# Patient Record
Sex: Female | Born: 1980
Health system: Southern US, Community
[De-identification: ages and names within clinical notes are randomized; demographics above are authoritative.]

## PROBLEM LIST (undated history)

## (undated) ENCOUNTER — Inpatient Hospital Stay (HOSPITAL_COMMUNITY): Payer: Self-pay

## (undated) DIAGNOSIS — O2210 Genital varices in pregnancy, unspecified trimester: Secondary | ICD-10-CM

## (undated) DIAGNOSIS — Z8719 Personal history of other diseases of the digestive system: Secondary | ICD-10-CM

## (undated) DIAGNOSIS — Z8619 Personal history of other infectious and parasitic diseases: Secondary | ICD-10-CM

## (undated) DIAGNOSIS — R51 Headache: Secondary | ICD-10-CM

## (undated) DIAGNOSIS — R87619 Unspecified abnormal cytological findings in specimens from cervix uteri: Secondary | ICD-10-CM

## (undated) DIAGNOSIS — I10 Essential (primary) hypertension: Secondary | ICD-10-CM

## (undated) DIAGNOSIS — R7303 Prediabetes: Secondary | ICD-10-CM

## (undated) DIAGNOSIS — IMO0002 Reserved for concepts with insufficient information to code with codable children: Secondary | ICD-10-CM

## (undated) HISTORY — DX: Prediabetes: R73.03

## (undated) HISTORY — DX: Genital varices in pregnancy, unspecified trimester: O22.10

## (undated) HISTORY — DX: Headache: R51

## (undated) HISTORY — DX: Unspecified abnormal cytological findings in specimens from cervix uteri: R87.619

## (undated) HISTORY — DX: Reserved for concepts with insufficient information to code with codable children: IMO0002

## (undated) HISTORY — DX: Personal history of other infectious and parasitic diseases: Z86.19

## (undated) HISTORY — DX: Morbid (severe) obesity due to excess calories: E66.01

## (undated) HISTORY — DX: Personal history of other diseases of the digestive system: Z87.19

---
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
## 2001-07-03 HISTORY — PX: OVARIAN CYST DRAINAGE: SHX325

## 2005-07-03 HISTORY — PX: CHOLECYSTECTOMY: SHX55

## 2010-11-22 LAB — ABO/RH: RH Type: POSITIVE

## 2010-11-22 LAB — GC/CHLAMYDIA PROBE AMP, GENITAL: Chlamydia: NEGATIVE

## 2010-11-22 LAB — HEPATITIS B SURFACE ANTIGEN: Hepatitis B Surface Ag: NEGATIVE

## 2010-11-22 LAB — RUBELLA ANTIBODY, IGM: Rubella: IMMUNE

## 2011-01-11 ENCOUNTER — Other Ambulatory Visit (HOSPITAL_COMMUNITY): Payer: Self-pay | Admitting: Obstetrics and Gynecology

## 2011-01-18 ENCOUNTER — Ambulatory Visit (HOSPITAL_COMMUNITY): Payer: Self-pay

## 2011-01-18 ENCOUNTER — Other Ambulatory Visit (HOSPITAL_COMMUNITY): Payer: Self-pay | Admitting: Obstetrics and Gynecology

## 2011-01-18 ENCOUNTER — Encounter (HOSPITAL_COMMUNITY): Payer: Self-pay

## 2011-01-18 ENCOUNTER — Ambulatory Visit (HOSPITAL_COMMUNITY)
Admission: RE | Admit: 2011-01-18 | Discharge: 2011-01-18 | Disposition: A | Discharge status: Home / Self Care | Payer: Self-pay | Source: Ambulatory Visit | Attending: Obstetrics and Gynecology | Admitting: Obstetrics and Gynecology

## 2011-01-18 DIAGNOSIS — Z3689 Encounter for other specified antenatal screening: Secondary | ICD-10-CM | POA: Insufficient documentation

## 2011-01-18 DIAGNOSIS — E669 Obesity, unspecified: Secondary | ICD-10-CM | POA: Insufficient documentation

## 2011-01-18 DIAGNOSIS — O2690 Pregnancy related conditions, unspecified, unspecified trimester: Secondary | ICD-10-CM

## 2011-03-01 ENCOUNTER — Ambulatory Visit (HOSPITAL_COMMUNITY)
Admission: RE | Admit: 2011-03-01 | Discharge: 2011-03-01 | Disposition: A | Discharge status: Home / Self Care | Payer: Self-pay | Source: Ambulatory Visit | Attending: Obstetrics and Gynecology | Admitting: Obstetrics and Gynecology

## 2011-03-01 VITALS — BP 130/72 | HR 85 | Wt 357.0 lb

## 2011-03-01 DIAGNOSIS — O2690 Pregnancy related conditions, unspecified, unspecified trimester: Secondary | ICD-10-CM

## 2011-03-01 DIAGNOSIS — O99210 Obesity complicating pregnancy, unspecified trimester: Secondary | ICD-10-CM | POA: Insufficient documentation

## 2011-03-01 DIAGNOSIS — E669 Obesity, unspecified: Secondary | ICD-10-CM | POA: Insufficient documentation

## 2011-03-01 DIAGNOSIS — Z3689 Encounter for other specified antenatal screening: Secondary | ICD-10-CM | POA: Insufficient documentation

## 2011-03-01 NOTE — Progress Notes (Signed)
Ultrasound in [facility].  Follow up U/S scheduled 

## 2011-03-02 NOTE — Progress Notes (Signed)
Encounter addended by: Marlana Latus, RN on: 03/02/2011  6:02 PM<BR>     Documentation filed: Episodes

## 2011-03-06 NOTE — Progress Notes (Signed)
See ultrasound report in [facility] 

## 2011-04-12 ENCOUNTER — Ambulatory Visit (HOSPITAL_COMMUNITY)
Admission: RE | Admit: 2011-04-12 | Discharge: 2011-04-12 | Disposition: A | Discharge status: Home / Self Care | Payer: Self-pay | Source: Ambulatory Visit | Attending: Obstetrics and Gynecology | Admitting: Obstetrics and Gynecology

## 2011-04-12 DIAGNOSIS — O358XX0 Maternal care for other (suspected) fetal abnormality and damage, not applicable or unspecified: Secondary | ICD-10-CM | POA: Insufficient documentation

## 2011-04-12 DIAGNOSIS — O99210 Obesity complicating pregnancy, unspecified trimester: Secondary | ICD-10-CM | POA: Insufficient documentation

## 2011-04-12 DIAGNOSIS — O2690 Pregnancy related conditions, unspecified, unspecified trimester: Secondary | ICD-10-CM

## 2011-04-12 DIAGNOSIS — E669 Obesity, unspecified: Secondary | ICD-10-CM | POA: Insufficient documentation

## 2011-05-10 ENCOUNTER — Ambulatory Visit (HOSPITAL_COMMUNITY)
Admission: RE | Admit: 2011-05-10 | Discharge: 2011-05-10 | Disposition: A | Discharge status: Home / Self Care | Payer: Self-pay | Source: Ambulatory Visit | Attending: Obstetrics and Gynecology | Admitting: Obstetrics and Gynecology

## 2011-05-10 DIAGNOSIS — O99210 Obesity complicating pregnancy, unspecified trimester: Secondary | ICD-10-CM | POA: Insufficient documentation

## 2011-05-10 DIAGNOSIS — O2690 Pregnancy related conditions, unspecified, unspecified trimester: Secondary | ICD-10-CM

## 2011-05-10 DIAGNOSIS — E669 Obesity, unspecified: Secondary | ICD-10-CM | POA: Insufficient documentation

## 2011-05-10 DIAGNOSIS — O358XX0 Maternal care for other (suspected) fetal abnormality and damage, not applicable or unspecified: Secondary | ICD-10-CM | POA: Insufficient documentation

## 2011-05-15 ENCOUNTER — Encounter (HOSPITAL_COMMUNITY): Payer: Self-pay | Admitting: *Deleted

## 2011-05-15 ENCOUNTER — Telehealth (HOSPITAL_COMMUNITY): Payer: Self-pay | Admitting: *Deleted

## 2011-05-15 NOTE — Telephone Encounter (Signed)
Preadmission screen  

## 2011-05-28 ENCOUNTER — Inpatient Hospital Stay (HOSPITAL_COMMUNITY): Payer: Medicaid Other | Admitting: Anesthesiology

## 2011-05-28 ENCOUNTER — Encounter (HOSPITAL_COMMUNITY): Payer: Self-pay | Admitting: *Deleted

## 2011-05-28 ENCOUNTER — Encounter (HOSPITAL_COMMUNITY): Payer: Self-pay | Admitting: Obstetrics and Gynecology

## 2011-05-28 ENCOUNTER — Inpatient Hospital Stay (HOSPITAL_COMMUNITY)
Admission: AD | Admit: 2011-05-28 | Discharge: 2011-05-30 | DRG: 775 | Disposition: A | Discharge status: Home / Self Care | Payer: Medicaid Other | Source: Ambulatory Visit | Attending: Obstetrics and Gynecology | Admitting: Obstetrics and Gynecology

## 2011-05-28 ENCOUNTER — Encounter (HOSPITAL_COMMUNITY): Payer: Self-pay | Admitting: Anesthesiology

## 2011-05-28 DIAGNOSIS — Z22330 Carrier of Group B streptococcus: Secondary | ICD-10-CM

## 2011-05-28 DIAGNOSIS — O878 Other venous complications in the puerperium: Secondary | ICD-10-CM | POA: Diagnosis present

## 2011-05-28 DIAGNOSIS — O99214 Obesity complicating childbirth: Secondary | ICD-10-CM | POA: Diagnosis present

## 2011-05-28 DIAGNOSIS — E669 Obesity, unspecified: Secondary | ICD-10-CM | POA: Diagnosis present

## 2011-05-28 DIAGNOSIS — K649 Unspecified hemorrhoids: Secondary | ICD-10-CM | POA: Diagnosis present

## 2011-05-28 DIAGNOSIS — O99892 Other specified diseases and conditions complicating childbirth: Secondary | ICD-10-CM | POA: Diagnosis present

## 2011-05-28 DIAGNOSIS — O2210 Genital varices in pregnancy, unspecified trimester: Secondary | ICD-10-CM | POA: Diagnosis present

## 2011-05-28 LAB — CBC
HCT: 37 % (ref 36.0–46.0)
Hemoglobin: 12.7 g/dL (ref 12.0–15.0)
MCHC: 34.3 g/dL (ref 30.0–36.0)
MCV: 90.2 fL (ref 78.0–100.0)
RDW: 13.9 % (ref 11.5–15.5)

## 2011-05-28 MED ORDER — DIBUCAINE 1 % RE OINT
1.0000 "application " | TOPICAL_OINTMENT | RECTAL | Status: DC | PRN
  Filled 2011-05-28: qty 28

## 2011-05-28 MED ORDER — FAMOTIDINE 20 MG PO TABS
20.0000 mg | ORAL_TABLET | Freq: Two times a day (BID) | ORAL | Status: DC
  Administered 2011-05-29 (×2): 20 mg via ORAL
  Filled 2011-05-28 (×2): qty 1

## 2011-05-28 MED ORDER — DEXTROSE 5 % IV SOLN
2.5000 10*6.[IU] | INTRAVENOUS | Status: DC
  Administered 2011-05-28: 2.5 10*6.[IU] via INTRAVENOUS
  Filled 2011-05-28 (×4): qty 2.5

## 2011-05-28 MED ORDER — LIDOCAINE HCL (PF) 1 % IJ SOLN
30.0000 mL | INTRAMUSCULAR | Status: DC | PRN
  Filled 2011-05-28: qty 30

## 2011-05-28 MED ORDER — LIDOCAINE HCL 1.5 % IJ SOLN
INTRAMUSCULAR | Status: DC | PRN
  Administered 2011-05-28 (×2): 5 mL via EPIDURAL

## 2011-05-28 MED ORDER — LANOLIN HYDROUS EX OINT: TOPICAL_OINTMENT | CUTANEOUS | Status: DC | PRN

## 2011-05-28 MED ORDER — SIMETHICONE 80 MG PO CHEW: 80.0000 mg | CHEWABLE_TABLET | ORAL | Status: DC | PRN

## 2011-05-28 MED ORDER — OXYCODONE-ACETAMINOPHEN 5-325 MG PO TABS
1.0000 | ORAL_TABLET | ORAL | Status: DC | PRN
  Administered 2011-05-29: 1 via ORAL
  Filled 2011-05-28: qty 1

## 2011-05-28 MED ORDER — MEASLES, MUMPS & RUBELLA VAC ~~LOC~~ INJ
0.5000 mL | INJECTION | Freq: Once | SUBCUTANEOUS | Status: DC
  Filled 2011-05-28: qty 0.5

## 2011-05-28 MED ORDER — ONDANSETRON HCL 4 MG/2ML IJ SOLN: 4.0000 mg | Freq: Four times a day (QID) | INTRAMUSCULAR | Status: DC | PRN

## 2011-05-28 MED ORDER — TETANUS-DIPHTH-ACELL PERTUSSIS 5-2.5-18.5 LF-MCG/0.5 IM SUSP
0.5000 mL | Freq: Once | INTRAMUSCULAR | Status: AC
  Administered 2011-05-29: 0.5 mL via INTRAMUSCULAR
  Filled 2011-05-28 (×2): qty 0.5

## 2011-05-28 MED ORDER — FLEET ENEMA 7-19 GM/118ML RE ENEM: 1.0000 | ENEMA | RECTAL | Status: DC | PRN

## 2011-05-28 MED ORDER — PRENATAL PLUS 27-1 MG PO TABS
1.0000 | ORAL_TABLET | Freq: Every day | ORAL | Status: DC
  Administered 2011-05-29 – 2011-05-30 (×2): 1 via ORAL
  Filled 2011-05-28 (×2): qty 1

## 2011-05-28 MED ORDER — ONDANSETRON HCL 4 MG/2ML IJ SOLN: 4.0000 mg | INTRAMUSCULAR | Status: DC | PRN

## 2011-05-28 MED ORDER — OXYTOCIN 20 UNITS IN LACTATED RINGERS INFUSION - SIMPLE: 125.0000 mL/h | INTRAVENOUS | Status: AC

## 2011-05-28 MED ORDER — HYDROCORTISONE 2.5 % RE CREA
1.0000 "application " | TOPICAL_CREAM | Freq: Two times a day (BID) | RECTAL | Status: DC
  Administered 2011-05-29 – 2011-05-30 (×4): 1 via RECTAL
  Filled 2011-05-28: qty 28.35

## 2011-05-28 MED ORDER — OXYTOCIN BOLUS FROM INFUSION
500.0000 mL | Freq: Once | INTRAVENOUS | Status: DC
  Filled 2011-05-28: qty 500

## 2011-05-28 MED ORDER — DIPHENHYDRAMINE HCL 25 MG PO CAPS: 25.0000 mg | ORAL_CAPSULE | Freq: Four times a day (QID) | ORAL | Status: DC | PRN

## 2011-05-28 MED ORDER — PENICILLIN G POTASSIUM 5000000 UNITS IJ SOLR
5.0000 10*6.[IU] | Freq: Once | INTRAVENOUS | Status: AC
  Administered 2011-05-28: 5 10*6.[IU] via INTRAVENOUS
  Filled 2011-05-28: qty 5

## 2011-05-28 MED ORDER — BENZOCAINE-MENTHOL 20-0.5 % EX AERO
INHALATION_SPRAY | CUTANEOUS | Status: AC
  Administered 2011-05-29: 02:00:00
  Filled 2011-05-28: qty 56

## 2011-05-28 MED ORDER — EPHEDRINE 5 MG/ML INJ
10.0000 mg | INTRAVENOUS | Status: DC | PRN
  Filled 2011-05-28: qty 4

## 2011-05-28 MED ORDER — CITRIC ACID-SODIUM CITRATE 334-500 MG/5ML PO SOLN: 30.0000 mL | ORAL | Status: DC | PRN

## 2011-05-28 MED ORDER — ACETAMINOPHEN 325 MG PO TABS: 650.0000 mg | ORAL_TABLET | ORAL | Status: DC | PRN

## 2011-05-28 MED ORDER — LACTATED RINGERS IV SOLN: 500.0000 mL | Freq: Once | INTRAVENOUS | Status: DC

## 2011-05-28 MED ORDER — ONDANSETRON HCL 4 MG PO TABS: 4.0000 mg | ORAL_TABLET | ORAL | Status: DC | PRN

## 2011-05-28 MED ORDER — DIPHENHYDRAMINE HCL 50 MG/ML IJ SOLN: 12.5000 mg | INTRAMUSCULAR | Status: DC | PRN

## 2011-05-28 MED ORDER — ZOLPIDEM TARTRATE 5 MG PO TABS: 5.0000 mg | ORAL_TABLET | Freq: Every evening | ORAL | Status: DC | PRN

## 2011-05-28 MED ORDER — BUTORPHANOL TARTRATE 2 MG/ML IJ SOLN
1.0000 mg | Freq: Once | INTRAMUSCULAR | Status: AC
  Administered 2011-05-28: 1 mg via INTRAVENOUS
  Filled 2011-05-28: qty 1

## 2011-05-28 MED ORDER — FENTANYL 2.5 MCG/ML BUPIVACAINE 1/10 % EPIDURAL INFUSION (WH - ANES)
14.0000 mL/h | INTRAMUSCULAR | Status: DC
  Filled 2011-05-28: qty 60

## 2011-05-28 MED ORDER — SENNOSIDES-DOCUSATE SODIUM 8.6-50 MG PO TABS
2.0000 | ORAL_TABLET | Freq: Every day | ORAL | Status: DC
  Administered 2011-05-29 (×2): 2 via ORAL

## 2011-05-28 MED ORDER — LACTATED RINGERS IV SOLN
INTRAVENOUS | Status: DC
  Administered 2011-05-28: 125 mL/h via INTRAVENOUS

## 2011-05-28 MED ORDER — OXYCODONE-ACETAMINOPHEN 5-325 MG PO TABS: 2.0000 | ORAL_TABLET | ORAL | Status: DC | PRN

## 2011-05-28 MED ORDER — PHENYLEPHRINE 40 MCG/ML (10ML) SYRINGE FOR IV PUSH (FOR BLOOD PRESSURE SUPPORT)
80.0000 ug | PREFILLED_SYRINGE | INTRAVENOUS | Status: DC | PRN
  Filled 2011-05-28: qty 5

## 2011-05-28 MED ORDER — LACTATED RINGERS IV SOLN
500.0000 mL | INTRAVENOUS | Status: DC | PRN
  Administered 2011-05-28 (×2): 1000 mL via INTRAVENOUS

## 2011-05-28 MED ORDER — FENTANYL 2.5 MCG/ML BUPIVACAINE 1/10 % EPIDURAL INFUSION (WH - ANES)
INTRAMUSCULAR | Status: DC | PRN
  Administered 2011-05-28: 14 mL/h via EPIDURAL

## 2011-05-28 MED ORDER — OXYTOCIN 20 UNITS IN LACTATED RINGERS INFUSION - SIMPLE
125.0000 mL/h | Freq: Once | INTRAVENOUS | Status: DC
  Filled 2011-05-28 (×2): qty 1000

## 2011-05-28 MED ORDER — PHENYLEPHRINE 40 MCG/ML (10ML) SYRINGE FOR IV PUSH (FOR BLOOD PRESSURE SUPPORT): 80.0000 ug | PREFILLED_SYRINGE | INTRAVENOUS | Status: DC | PRN

## 2011-05-28 MED ORDER — BENZOCAINE-MENTHOL 20-0.5 % EX AERO
1.0000 "application " | INHALATION_SPRAY | CUTANEOUS | Status: DC | PRN
  Filled 2011-05-28: qty 56

## 2011-05-28 MED ORDER — IBUPROFEN 600 MG PO TABS
600.0000 mg | ORAL_TABLET | Freq: Four times a day (QID) | ORAL | Status: DC
  Administered 2011-05-28 – 2011-05-30 (×7): 600 mg via ORAL
  Filled 2011-05-28 (×8): qty 1

## 2011-05-28 MED ORDER — EPHEDRINE 5 MG/ML INJ: 10.0000 mg | INTRAVENOUS | Status: DC | PRN

## 2011-05-28 MED ORDER — IBUPROFEN 600 MG PO TABS: 600.0000 mg | ORAL_TABLET | Freq: Four times a day (QID) | ORAL | Status: DC | PRN

## 2011-05-28 NOTE — Progress Notes (Signed)
Pt reports having ctx since 0545 this am q 3-5 min. Denies SROM or Bleeding at this time. Reports good fetal movement

## 2011-05-28 NOTE — Anesthesia Procedure Notes (Signed)
Epidural Patient location during procedure: OB Start time: 05/28/2011 2:07 PM End time: 05/28/2011 2:13 PM Reason for block: procedure for pain  Staffing Anesthesiologist: Sandrea Hughs Performed by: anesthesiologist   Preanesthetic Checklist Completed: patient identified, site marked, surgical consent, pre-op evaluation, timeout performed, IV checked, risks and benefits discussed and monitors and equipment checked  Epidural Patient position: sitting Prep: site prepped and draped and DuraPrep Patient monitoring: continuous pulse ox and blood pressure Approach: midline Injection technique: LOR air  Needle:  Needle type: Tuohy  Needle gauge: 17 G Needle length: 9 cm Needle insertion depth: 8 cm Catheter type: closed end flexible Catheter size: 19 Gauge Catheter at skin depth: 14 cm Test dose: negative and 1.5% lidocaine  Assessment Sensory level: T8 Events: blood not aspirated, injection not painful, no injection resistance, negative IV test and no paresthesia

## 2011-05-28 NOTE — Progress Notes (Signed)
Patient ID: Kayla [name], female   DOB: 1981-04-01, 30 y.o.   MRN: [id number] Pt reached full dilatation and with 2 contractions was able to push the baby to crowning. With her third push she delivered the vertex and the vertex did the "turtle sign" I asked the nurses to pull her legs back and to give suprapubic pressure. I reached into the vagina and was able to get the axilla of 1 arm and then easily delivered the 7 pound 6 ounce female infant. Apgars were 8 at 1 minute and 9 at 5 minutes. The placenta delivered intact and the uterus was normal. There were huge hemorrhoids and huge vulvar varicosities necessitating a nurse's services in holding the enormous labia out of the surgical field. The second degree ML laceration was sutured with 3-0 vicryl. The hemorrhoids were at 8 and 12 o'clock. Blood loss was difficult to determine but was almost exclusively from the vaginal and perineal tear.

## 2011-05-28 NOTE — Anesthesia Postprocedure Evaluation (Signed)
Anesthesia Post Note  Patient: Kayla [name]  Procedure(s) Performed: * No procedures listed *  Anesthesia type: Epidural  Patient location: Mother/Baby  Post pain: Pain level controlled  Post assessment: Post-op Vital signs reviewed  Last Vitals:  Filed Vitals:   05/28/11 1815  BP: 100/54  Pulse: 85  Temp: 37.2 C  Resp: 16    Post vital signs: Reviewed  Level of consciousness: awake  Complications: No apparent anesthesia complications

## 2011-05-28 NOTE — Anesthesia Preprocedure Evaluation (Signed)
Anesthesia Evaluation  Patient identified by MRN, date of birth, ID band Patient awake    Reviewed: Allergy & Precautions, H&P , NPO status , Patient's Chart, lab work & pertinent test results  Airway Mallampati: III TM Distance: >3 FB Neck ROM: full    Dental No notable dental hx.    Pulmonary neg pulmonary ROS,  clear to auscultation  Pulmonary exam normal       Cardiovascular neg cardio ROS     Neuro/Psych Negative Psych ROS   GI/Hepatic negative GI ROS, Neg liver ROS,   Endo/Other  Morbid obesity  Renal/GU negative Renal ROS  Genitourinary negative   Musculoskeletal negative musculoskeletal ROS (+)   Abdominal (+) obese,   Peds negative pediatric ROS (+)  Hematology negative hematology ROS (+)   Anesthesia Other Findings   Reproductive/Obstetrics (+) Pregnancy                           Anesthesia Physical Anesthesia Plan  ASA: III  Anesthesia Plan: Epidural   Post-op Pain Management:    Induction:   Airway Management Planned:   Additional Equipment:   Intra-op Plan:   Post-operative Plan:   Informed Consent: I have reviewed the patients History and Physical, chart, labs and discussed the procedure including the risks, benefits and alternatives for the proposed anesthesia with the patient or authorized representative who has indicated his/her understanding and acceptance.     Plan Discussed with:   Anesthesia Plan Comments:         Anesthesia Quick Evaluation

## 2011-05-28 NOTE — Progress Notes (Signed)
Patient ID: Kayla [name], female   DOB: 12/06/1980, 30 y.o.   MRN: [id number] Pt has received an epidural and is comfortable. Per the RN exam at 2:30 PM the cervix is still 8 cm but the vertex has descended to 0 station.

## 2011-05-28 NOTE — H&P (Signed)
NAMEBRESLYN, ABDO NO.:  [id number]  MEDICAL RECORD NO.:  [id number]  LOCATION:  9162                          FACILITY:  [facility]  PHYSICIAN:  Malachi Pro. Ambrose Mantle, M.D. DATE OF BIRTH:  1981/02/27  DATE OF ADMISSION:  05/28/2011 DATE OF DISCHARGE:                             HISTORY & PHYSICAL   HISTORY OF PRESENT ILLNESS:  This is a 30 year old black female, para 0, gravida 1, EDC June 04, 2011, by an ultrasound at 9 weeks and 1/7 days on October 31, 2010.  Blood group and type O positive, negative antibody.  Pap smear normal.  Rubella immune.  RPR nonreactive.  Urine culture negative.  Hepatitis B surface antigen negative, HIV negative, GC and Chlamydia negative, hemoglobin electrophoresis AA, cystic fibrosis screening, first trimester screening, and quad screening all declined.  One hour Glucola 101, group B strep positive.  The patient did have an early ultrasound at 9 weeks and 1 day, gave her a due date of June 04, 2011.  By her dates, her due date was May 29, 2011, and the chosen due date is June 14, 2011.  She had a followup ultrasound in our office, but the anatomy was limited because of her massive obesity.  She did have [facility], and the anatomy seemed to be normal except for an echogenic intracardiac focus.  There was normal fluid volume and growth appeared adequate.  The patient had a relatively benign prenatal course.  She began contracting at 05:54 a.m. on the day of admission, came to the hospital and was felt to be 4 cm dilated, 60%, with a vertex at a -3 and a bulging bag of waters.  She was admitted to the hospital, and on my exam at or 11:05 a.m. or 11:10 a.m., the cervix was 7 cm, 80% to 90%, vertex was at -3.  Artificial rupture of the membranes produced clear fluid.  PAST MEDICAL HISTORY:  She has had an abnormal Pap, but a normal colposcopy.  She had Chlamydia.  She  has had gallstones, headaches, hemorrhoids, an ovarian cyst, and vaginal trichomoniasis.  She has had a cholecystectomy in 2007.  ALLERGIES:  BACTRIM CAUSED BODY ACHES, HEADACHES, NAUSEA, AND VOMITING. SHE HAS NO FOOD ALLERGIES AND NO LATEX ALLERGIES.  FAMILY HISTORY:  Maternal grandmother with breast cancer.  Father with cardiomyopathy and congestive heart failure, died at age 7.  He also had diabetes and hypertension, and had a myocardial infarction. Maternal grandmother also had stomach cancer.  The patient denies alcohol, tobacco, and illicit substance abuse.  PHYSICAL EXAMINATION:  VITAL SIGNS:  On admission, her blood pressure was 95/52 to 117/64, pulse 77-79, temperature 98.4 to 98.9. HEART:  Normal size and sounds.  No murmurs. LUNGS:  Clear to auscultation. ABDOMEN:  The abdomen is markedly obese.  The patient weighs well in excess of 350 pounds.  Fetal heart tones are normal. GU:  Cervix is 7 cm, 80% to 90% vertex at a -3 artificial rupture of the membranes produced clear fluid.  ADMITTING IMPRESSION:  Intrauterine pregnancy at 39 weeks, positive group B strep.  Massive obesity.  The patient is admitted and placed  on penicillin, and her progress in labor will be monitored.     Malachi Pro. Ambrose Mantle, M.D.     TFH/MEDQ  D:  05/28/2011  T:  05/28/2011  Job:  [id number]

## 2011-05-29 LAB — CBC
HCT: 28.3 % — ABNORMAL LOW (ref 36.0–46.0)
Hemoglobin: 9.6 g/dL — ABNORMAL LOW (ref 12.0–15.0)
MCV: 90.1 fL (ref 78.0–100.0)
RBC: 3.14 MIL/uL — ABNORMAL LOW (ref 3.87–5.11)
RDW: 14 % (ref 11.5–15.5)
WBC: 10.9 10*3/uL — ABNORMAL HIGH (ref 4.0–10.5)

## 2011-05-29 NOTE — Progress Notes (Signed)
UR chart review completed.  

## 2011-05-29 NOTE — Progress Notes (Signed)
Patient ID: Kayla [name], female   DOB: 01-25-1981, 30 y.o.   MRN: [id number] #1 afebrile BP normal Did not get SCD"s HGB 12.7 to 9.6.

## 2011-05-30 MED ORDER — OXYCODONE-ACETAMINOPHEN 5-325 MG PO TABS: 1.0000 | ORAL_TABLET | ORAL | Status: AC | PRN

## 2011-05-30 MED ORDER — IBUPROFEN 600 MG PO TABS: 600.0000 mg | ORAL_TABLET | Freq: Four times a day (QID) | ORAL | Status: AC

## 2011-05-30 NOTE — Discharge Summary (Signed)
Kayla [name], BIGFORD NO.:  [id number]  MEDICAL RECORD NO.:  [id number]  LOCATION:  9116                          [facility]ACILITY:  [facility]  PHYSICIAN:  Malachi Pro. Ambrose Mantle, M.D. DATE OF BIRTH:  Sep 19, 1980  DATE OF ADMISSION:  05/28/2011 DATE OF DISCHARGE:                              DISCHARGE SUMMARY   ADDENDUM:  LABORATORY DATA:  Initial hemoglobin was 12.7, hematocrit 37.0, white count 6000, platelet count 218,000.  RPR was nonreactive.  Followup hemoglobin 9.6, hematocrit 28.3, white count 10,900, and platelet count 211,000.     Malachi Pro. Ambrose Mantle, M.D.     TFH/MEDQ  D:  05/30/2011  T:  05/30/2011  Job:  [id number]

## 2011-05-30 NOTE — Progress Notes (Signed)
Patient ID: Kayla [name], female   DOB: May 10, 1981, 30 y.o.   MRN: [id number] #2 afebrile for D/C

## 2011-05-30 NOTE — Discharge Summary (Signed)
Kayla [name], Kayla [name] NO.:  [id number]  MEDICAL RECORD NO.:  [id number]  LOCATION:  9116                          [facility]ACILITY:  [facility]  PHYSICIAN:  Malachi Pro. Ambrose Mantle, M.D. DATE OF BIRTH:  20-May-1981  DATE OF ADMISSION:  05/28/2011 DATE OF DISCHARGE:  05/30/2011                              DISCHARGE SUMMARY   HISTORY AND HOSPITAL COURSE:  This is a 30 year old black female, para 0, gravida 1, EDC June 04, 2011 by an ultrasound at 9 weeks and 1/7th days on March 02, 2011.  Blood group and type O positive, negative antibody.  Pap smear normal.  Rubella immune.  RPR nonreactive.  Urine culture negative.  Hepatitis B surface antigen negative.  HIV negative. GC and Chlamydia negative.  Hemoglobin electrophoresis AA.  Cystic fibrosis screening, first trimester screening, and quad screening all declined.  One-hour Glucola 101.  Group-B strep positive.  The patient's pregnancy was complicated by massive obesity.  She did began contracting on the day of admission at 5:54 a.m., came to the hospital, and was felt to be 4 cm by the admitting nurse in MAU.  She was admitted to the hospital and on my exam at 11:05 a.m., the cervix was 7 cm, 80-90% vertex at a -3 and artificial rupture of the membranes produced clear fluid.  She was begun on penicillin and received an epidural.  She became comfortable.  Per the RN exam at 2:30 a.m., the cervix was still 8 cm, but the vertex had descended to a 0 station.  She reached full dilatation and with 2 contractions was able to push the baby down to crowning with the third push she delivered the vertex and the vertex did the "turtle sign."  I asked the nurses to pull her legs back and give suprapubic pressure.  I reached into the vagina and was able to get the axilla of 1 arm and then easily delivered a 7 pounds 6 ounce female infant.  Apgars were 8 at 1 and 9 at 5 minutes.  The placenta delivered intact.  The uterus was normal.  There were  huge hemorrhoids and huge vulvar varicosities necessitating a nurses services and holding the enormous labia out of the surgical field.  The second-degree midline laceration was sutured with 3-0 Vicryl.  The hemorrhoids were at 8 and 12 o'clock.  Blood loss was difficult to determine but was almost exclusively from the vaginal and perineal tears.  Postpartum, the patient did well and was discharged on the second postpartum day.  DISCHARGE DIAGNOSES:  Intrauterine pregnancy at 39 weeks, delivered vertex, positive group B strep, shoulder dystocia.  OPERATION:  Spontaneous delivery, vertex, repair of second-degree midline laceration.  FINAL CONDITION:  Improved.  DISCHARGE INSTRUCTIONS:  Include our regular discharge instruction booklet.  The patient is to continue her prenatal vitamins.  Her Proctofoam for hemorrhoids, Motrin 600 mg 30 tablets, 1 every 6 hours as needed for pain, and Percocet 5/325 ten tablets, 1 every 6 hours as needed for pain, and to return to the office in 6 weeks for followup examination.     Malachi Pro. Ambrose Mantle, M.D.     TFH/MEDQ  D:  05/30/2011  T:  05/30/2011  Job:  [id number] 

## 2011-11-03 IMAGING — US US OB FOLLOW-UP
1 series · 14 of 28 positions shown · non-contrast
Comparison: none

[Series 1: us ob follow-up · 0.19mm/px · 35 acquisitions, 14 frames shown]
[im 2/35]
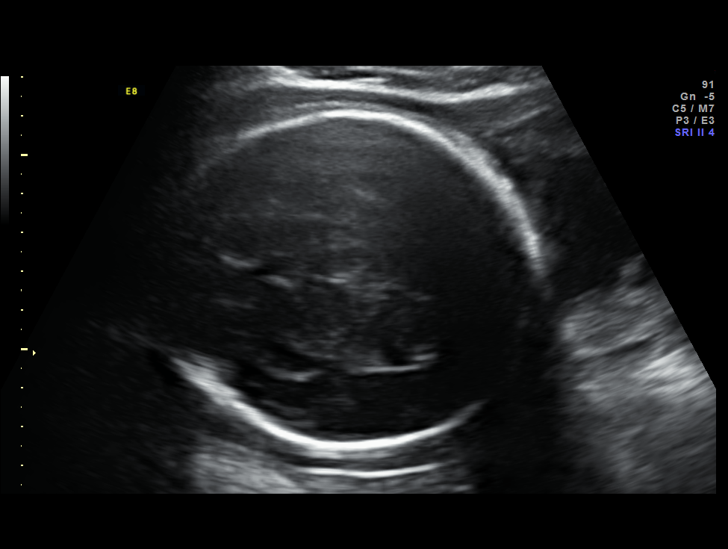
[im 4/35]
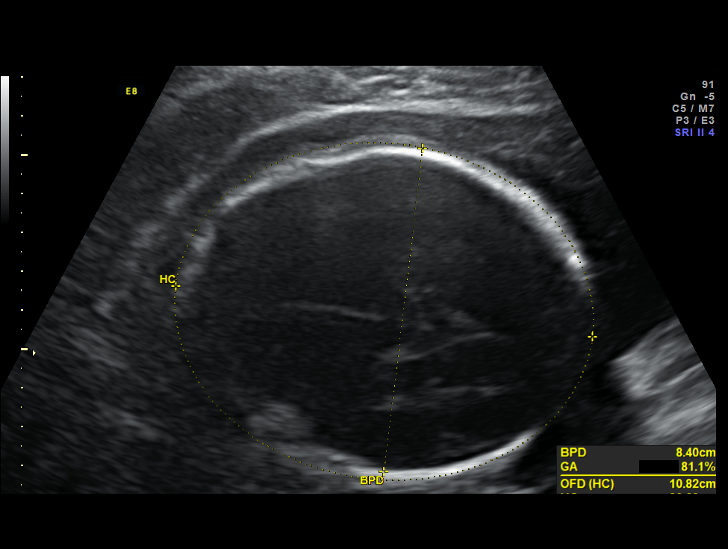
[im 7/35]
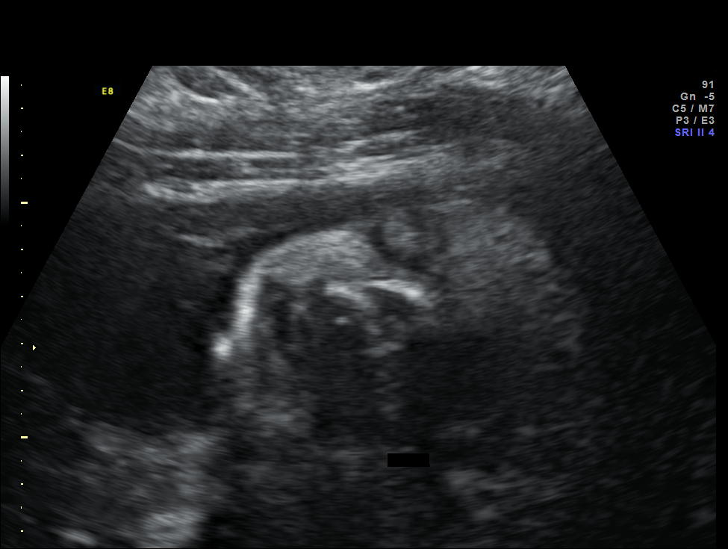
[im 9/35]
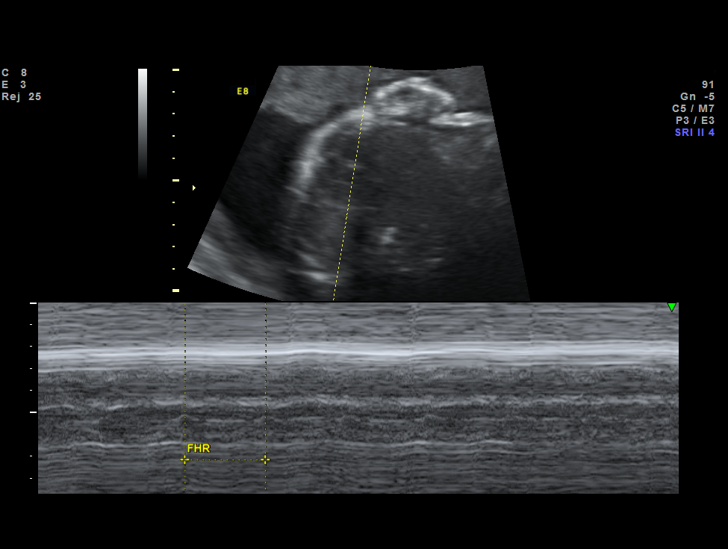
[im 12/35]
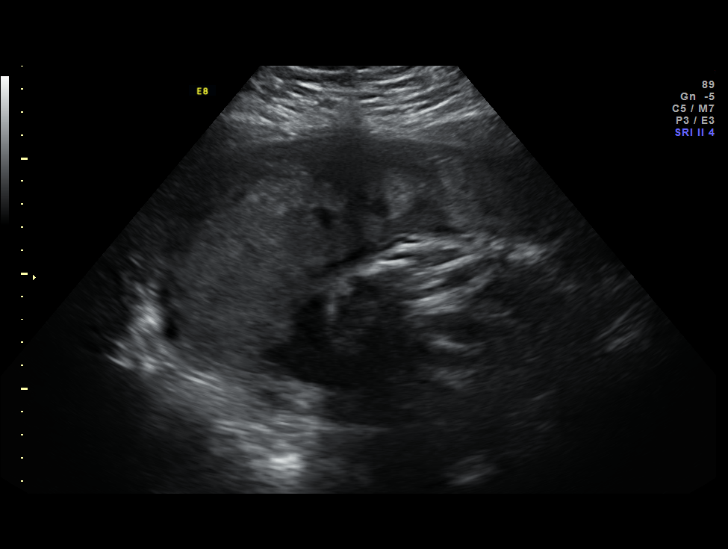
[im 14/35]
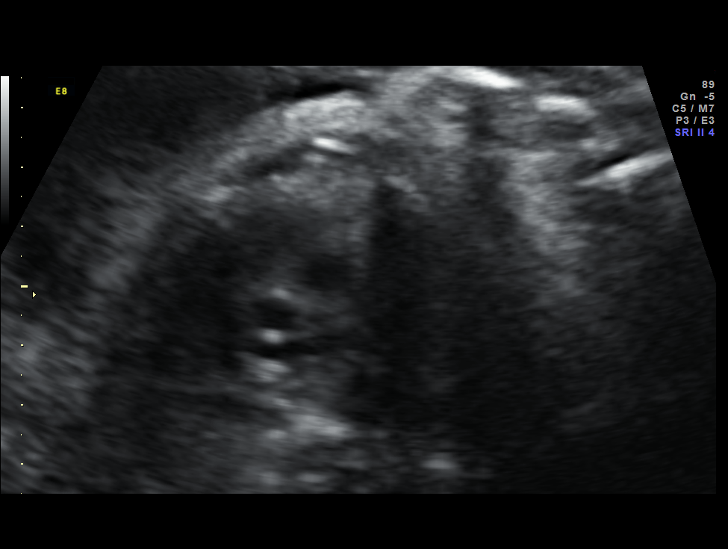
[im 17/35]
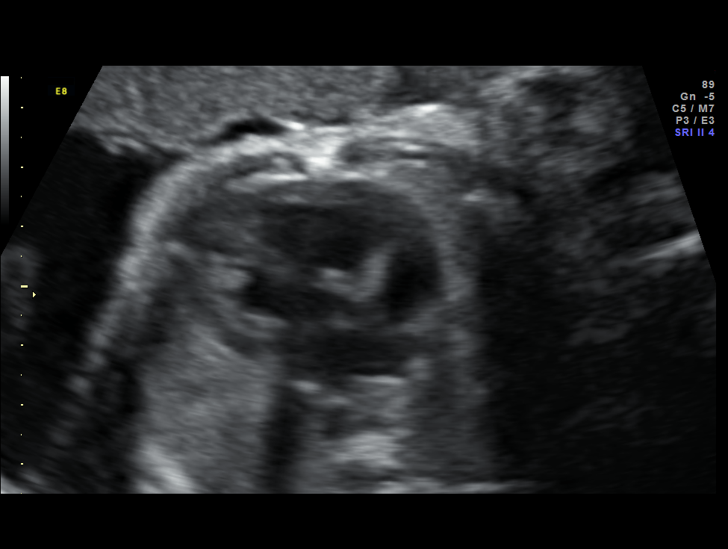
[im 19/35]
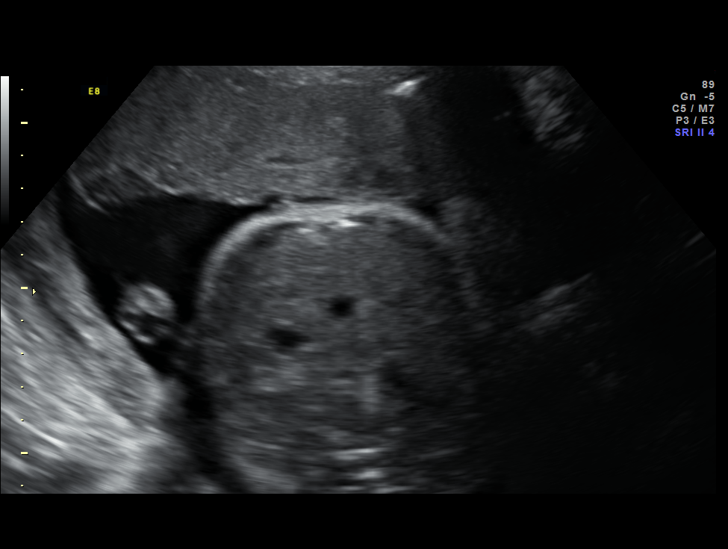
[im 22/35]
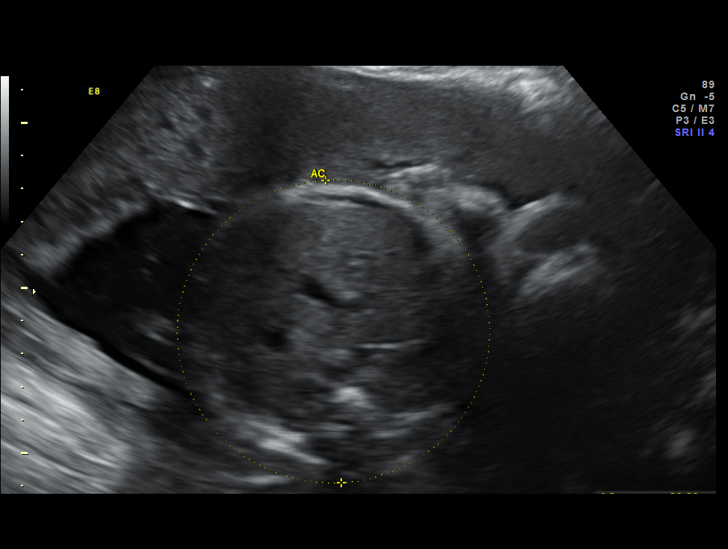
[im 24/35]
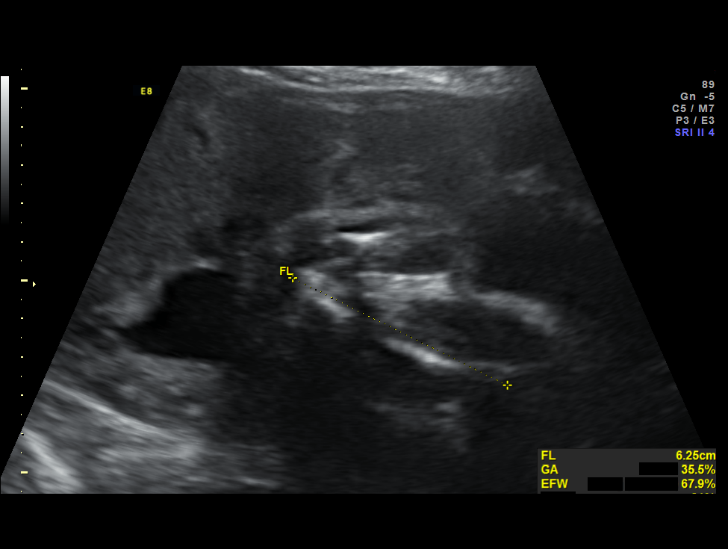
[im 27/35]
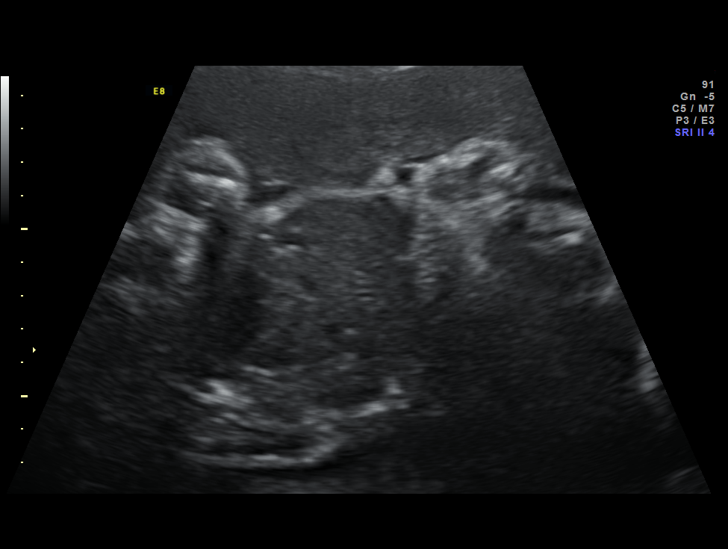
[im 29/35]
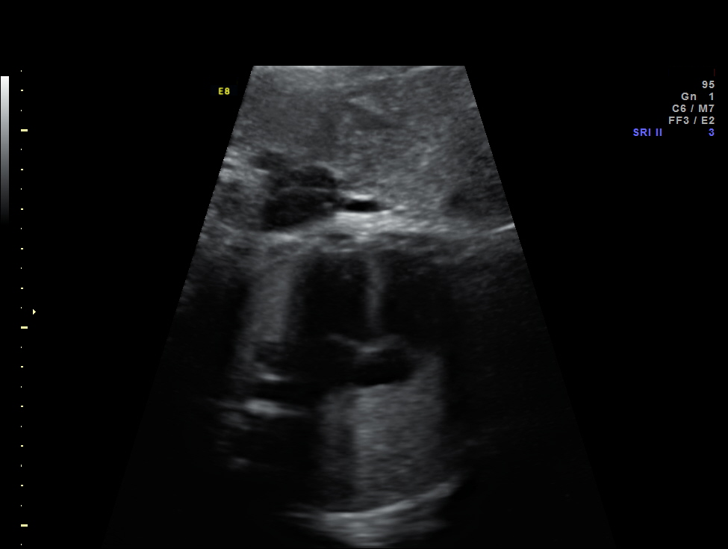
[im 32/35]
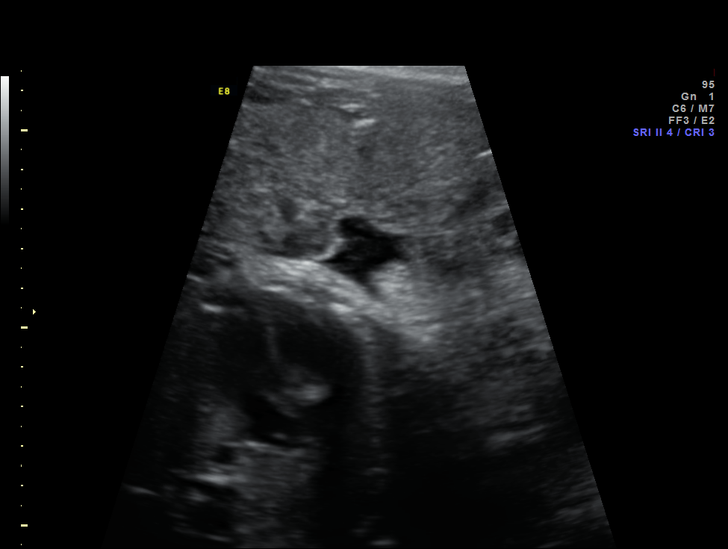
[im 35/35]
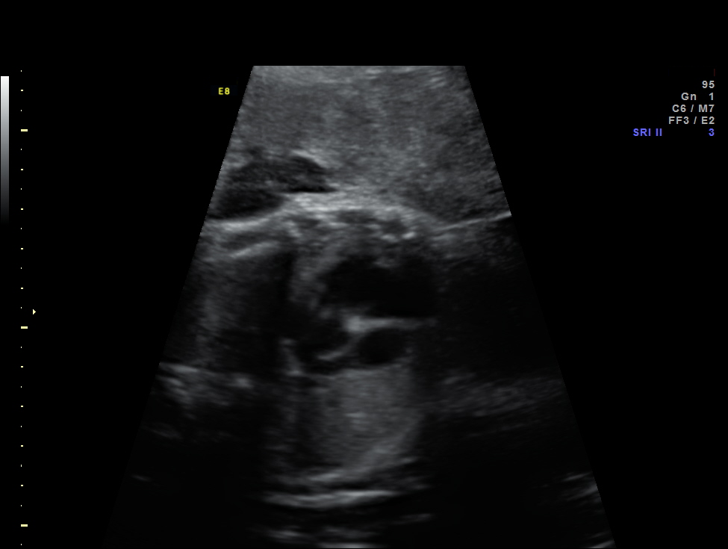

[14 of 28 positions shown; findings below may reference images not displayed]

Canned report from images found in remote index.

Refer to host system for actual result text.

## 2013-03-18 ENCOUNTER — Emergency Department (INDEPENDENT_AMBULATORY_CARE_PROVIDER_SITE_OTHER)
Admission: EM | Admit: 2013-03-18 | Discharge: 2013-03-18 | Disposition: A | Discharge status: Home / Self Care | Payer: Self-pay | Source: Home / Self Care | Attending: Emergency Medicine | Admitting: Emergency Medicine

## 2013-03-18 ENCOUNTER — Encounter (HOSPITAL_COMMUNITY): Payer: Self-pay | Admitting: *Deleted

## 2013-03-18 ENCOUNTER — Other Ambulatory Visit (HOSPITAL_COMMUNITY)
Admission: RE | Admit: 2013-03-18 | Discharge: 2013-03-18 | Disposition: A | Discharge status: Home / Self Care | Payer: Medicaid Other | Source: Ambulatory Visit | Attending: Emergency Medicine | Admitting: Emergency Medicine

## 2013-03-18 DIAGNOSIS — N760 Acute vaginitis: Secondary | ICD-10-CM | POA: Insufficient documentation

## 2013-03-18 DIAGNOSIS — Z113 Encounter for screening for infections with a predominantly sexual mode of transmission: Secondary | ICD-10-CM | POA: Insufficient documentation

## 2013-03-18 DIAGNOSIS — N390 Urinary tract infection, site not specified: Secondary | ICD-10-CM

## 2013-03-18 DIAGNOSIS — R102 Pelvic and perineal pain: Secondary | ICD-10-CM

## 2013-03-18 DIAGNOSIS — N949 Unspecified condition associated with female genital organs and menstrual cycle: Secondary | ICD-10-CM

## 2013-03-18 LAB — POCT URINALYSIS DIP (DEVICE)
Bilirubin Urine: NEGATIVE
Glucose, UA: NEGATIVE mg/dL
Hgb urine dipstick: NEGATIVE
Ketones, ur: NEGATIVE mg/dL
Nitrite: NEGATIVE
pH: 7.5 (ref 5.0–8.0)

## 2013-03-18 MED ORDER — METRONIDAZOLE 500 MG PO TABS: 500.0000 mg | ORAL_TABLET | Freq: Two times a day (BID) | ORAL | Status: DC

## 2013-03-18 MED ORDER — CEPHALEXIN 500 MG PO CAPS: 500.0000 mg | ORAL_CAPSULE | Freq: Three times a day (TID) | ORAL | Status: DC

## 2013-03-18 MED ORDER — TRAMADOL HCL 50 MG PO TABS: 100.0000 mg | ORAL_TABLET | Freq: Three times a day (TID) | ORAL | Status: DC | PRN

## 2013-03-18 NOTE — ED Notes (Signed)
Pt  Reports   Frequent  Scanty   Urination  With  Sensation of  Not  empting  Bladder  Completely          Ambulated  Top room with a  Slow  Steady  Gait     -  Also  Reports  Some low  abd  /  Ovary  Pain  Earlier  At this  Time  She  Is  Sitting  uprigght on  Exam table in no acute  Distress

## 2013-03-18 NOTE — ED Provider Notes (Signed)
Chief Complaint:   Chief Complaint  Patient presents with  . Urinary Frequency    History of Present Illness:    Kayla [name] is a 32 year old female who has had a one-week history of bilateral pelvic pain worse on the right than the left. This radiates to the vagina and she's had some discharge and also radiates through to the back. The pain last for minutes and comes and goes, being rated 5/10 in intensity. She's also had some urinary urgency but denies any dysuria, frequency, or hematuria. She denies fever, chills, nausea, vomiting, anorexia, weight loss. No constipation, diarrhea, blood in the stools. Her menses have been regular. She has a history of ovarian cysts dating back to 2003.  Review of Systems:  Other than noted above, the patient denies any of the following symptoms: Constitutional:  No fever, chills, fatigue, weight loss or anorexia. Lungs:  No cough or shortness of breath. Heart:  No chest pain, palpitations, syncope or edema.  No cardiac history. Abdomen:  No nausea, vomiting, hematememesis, melena, diarrhea, or hematochezia. GU:  No dysuria, frequency, urgency, or hematuria. Gyn:  No vaginal discharge, itching, abnormal bleeding, dyspareunia, or pelvic pain.  PMFSH:  Past medical history, family history, social history, meds, and allergies were reviewed along with nurse's notes.  No prior abdominal surgeries, past history of GI problems, STDs or GYN problems.  No history of aspirin or NSAID use.  No excessive alcohol intake. She is allergic to Bactrim.  Physical Exam:   Vital signs:  BP 149/89  Pulse 74  Temp(Src) 98.2 F (36.8 C) (Oral)  Resp 18  SpO2 98% Gen:  Alert, oriented, in no distress. Lungs:  Breath sounds clear and equal bilaterally.  No wheezes, rales or rhonchi. Heart:  Regular rhythm.  No gallops or murmers.   Abdomen:  Abdomen is soft, flat, nondistended, but she is morbidly obese and examination is difficult. There is no bowel tenderness to  palpation, guarding, rebound. No organomegaly or mass. Bowel sounds were normally active. Pelvic:  Normal external genitalia, vaginal and cervical mucosa were normal. There was a small amount of discharge. No pain on cervical motion. Uterus was midposition, normal in size and shape, no adnexal masses or tenderness. Exam was limited by patient's bodily habitus. Skin:  Clear, warm and dry.  No rash.  Labs:   Results for orders placed during the hospital encounter of 03/18/13  POCT URINALYSIS DIP (DEVICE)      Result Value Range   Glucose, UA NEGATIVE  NEGATIVE mg/dL   Bilirubin Urine NEGATIVE  NEGATIVE   Ketones, ur NEGATIVE  NEGATIVE mg/dL   Specific Gravity, Urine 1.020  1.005 - 1.030   Hgb urine dipstick NEGATIVE  NEGATIVE   pH 7.5  5.0 - 8.0   Protein, ur NEGATIVE  NEGATIVE mg/dL   Urobilinogen, UA 0.2  0.0 - 1.0 mg/dL   Nitrite NEGATIVE  NEGATIVE   Leukocytes, UA SMALL (*) NEGATIVE  POCT PREGNANCY, URINE      Result Value Range   Preg Test, Ur NEGATIVE  NEGATIVE    Assessment:  The primary encounter diagnosis was Pelvic pain. Diagnoses of Vaginitis and UTI (lower urinary tract infection) were also pertinent to this visit.  Differential diagnosis includes PID, ovarian cyst, fibroid tumors, endometriosis, or urinary tract infection. We'll treat for right now for UTI since she did have a small amount of leukocytes present, and BV. Results of DNA probes are pending. Suggested she followup with her gynecologist, Dr. Hilbert Bible,  for pelvic ultrasound to rule out ovarian cyst or fibroid tumors. I cannot definitely rule this out in the base of her exam today because of her bodily habitus.  Plan:   1.  Meds:  The following meds were prescribed:   Discharge Medication List as of 03/18/2013 12:13 PM    START taking these medications   Details  cephALEXin (KEFLEX) 500 MG capsule Take 1 capsule (500 mg total) by mouth 3 (three) times daily., Starting 03/18/2013, Until Discontinued, Normal     metroNIDAZOLE (FLAGYL) 500 MG tablet Take 1 tablet (500 mg total) by mouth 2 (two) times daily., Starting 03/18/2013, Until Discontinued, Normal    traMADol (ULTRAM) 50 MG tablet Take 2 tablets (100 mg total) by mouth every 8 (eight) hours as needed for pain., Starting 03/18/2013, Until Discontinued, Normal        2.  Patient Education/Counseling:  The patient was given appropriate handouts, self care instructions, and instructed in symptomatic relief.   3.  Follow up:  The patient was told to follow up if no better in 3 to 4 days, if becoming worse in any way, and given some red flag symptoms such as worsening pain which would prompt immediate return.  Follow up with Dr. Hilbert Bible within the next 2 weeks.    Reuben Likes, MD 03/18/13 [phone]

## 2013-03-19 LAB — URINE CULTURE
Colony Count: NO GROWTH
Culture: NO GROWTH
Special Requests: NORMAL

## 2013-03-19 NOTE — ED Notes (Signed)
GC/Chlamydia neg., Affirm: Candida and Trich neg., Gardnerella pos., Urine culture: No growth.  Pt. Adequately treated with Flagyl. Vassie Moselle 03/19/2013

## 2013-04-06 ENCOUNTER — Encounter (HOSPITAL_COMMUNITY): Payer: Self-pay | Admitting: Emergency Medicine

## 2013-04-06 ENCOUNTER — Emergency Department (HOSPITAL_COMMUNITY)
Admission: EM | Admit: 2013-04-06 | Discharge: 2013-04-06 | Disposition: A | Discharge status: Home / Self Care | Payer: Medicaid Other | Attending: Emergency Medicine | Admitting: Emergency Medicine

## 2013-04-06 DIAGNOSIS — N390 Urinary tract infection, site not specified: Secondary | ICD-10-CM | POA: Insufficient documentation

## 2013-04-06 DIAGNOSIS — Z87898 Personal history of other specified conditions: Secondary | ICD-10-CM | POA: Insufficient documentation

## 2013-04-06 DIAGNOSIS — Z8619 Personal history of other infectious and parasitic diseases: Secondary | ICD-10-CM | POA: Insufficient documentation

## 2013-04-06 DIAGNOSIS — Z8679 Personal history of other diseases of the circulatory system: Secondary | ICD-10-CM | POA: Insufficient documentation

## 2013-04-06 DIAGNOSIS — Z8742 Personal history of other diseases of the female genital tract: Secondary | ICD-10-CM | POA: Insufficient documentation

## 2013-04-06 DIAGNOSIS — Z79899 Other long term (current) drug therapy: Secondary | ICD-10-CM | POA: Insufficient documentation

## 2013-04-06 DIAGNOSIS — Z3202 Encounter for pregnancy test, result negative: Secondary | ICD-10-CM | POA: Insufficient documentation

## 2013-04-06 LAB — URINALYSIS W MICROSCOPIC + REFLEX CULTURE
Ketones, ur: NEGATIVE mg/dL
Nitrite: POSITIVE — AB
Protein, ur: 100 mg/dL — AB
Urobilinogen, UA: 1 mg/dL (ref 0.0–1.0)

## 2013-04-06 LAB — WET PREP, GENITAL
Clue Cells Wet Prep HPF POC: NONE SEEN
Trich, Wet Prep: NONE SEEN

## 2013-04-06 MED ORDER — PHENAZOPYRIDINE HCL 100 MG PO TABS: 100.0000 mg | ORAL_TABLET | Freq: Three times a day (TID) | ORAL | Status: DC

## 2013-04-06 MED ORDER — NITROFURANTOIN MONOHYD MACRO 100 MG PO CAPS
100.0000 mg | ORAL_CAPSULE | ORAL | Status: AC
  Administered 2013-04-06: 100 mg via ORAL
  Filled 2013-04-06: qty 1

## 2013-04-06 MED ORDER — NITROFURANTOIN MONOHYD MACRO 100 MG PO CAPS: 100.0000 mg | ORAL_CAPSULE | Freq: Two times a day (BID) | ORAL | Status: DC

## 2013-04-06 NOTE — ED Provider Notes (Signed)
CSN: [id number]     Arrival date & time 04/06/13  2115 History   First MD Initiated Contact with Patient 04/06/13 2148     Chief Complaint  Patient presents with  . Hematuria  . Urinary Frequency    HPI Pt was seen at 2210. Per pt, c/o gradual onset and persistence of constant dysuria for the past 2 to 3 days. Has been associated with urinary frequency, urgency and vaginal discharge. Pt states she had similar symptoms 2 weeks ago, was evaluated at [facility], dx UTI and vaginitis, rx keflex and flagyl. States she took these meds with improvement in her symptoms. Pt states she took an OTC "yeast infection medicine" 3 days ago when her new symptoms began, without improvement. Denies hematuria, no vaginal bleeding, no back/flank pain, no abd pain, no N/V/D, no fevers, no rash.     Past Medical History  Diagnosis Date  . Morbidly obese   . History of hemorrhoids   . Varicose vulva in pregnancy   . Abnormal Pap smear   . History of chlamydia   . Headache(784.0)   . History of hemorrhoids    Past Surgical History  Procedure Laterality Date  . Cholecystectomy  2007  . Ovarian cyst drainage  2003   Family History  Problem Relation Age of Onset  . Cardiomyopathy Father   . Heart failure Father   . Diabetes Father   . Hypertension Father   . Heart attack Father   . Hypertension Paternal Uncle   . Cancer Maternal Grandmother     breast and stomach  . Anesthesia problems Neg Hx   . Hypotension Neg Hx   . Malignant hyperthermia Neg Hx   . Pseudochol deficiency Neg Hx    History  Substance Use Topics  . Smoking status: Never Smoker   . Smokeless tobacco: Never Used  . Alcohol Use: No   OB History   Grav Para Term Preterm Abortions TAB SAB Ect Mult Living   1 1 1  0 0 0 0 0 0 1     Review of Systems ROS: Statement: All systems negative except as marked or noted in the HPI; Constitutional: Negative for fever and chills. ; ; Eyes: Negative for eye pain, redness and discharge. ; ; ENMT:  Negative for ear pain, hoarseness, nasal congestion, sinus pressure and sore throat. ; ; Cardiovascular: Negative for chest pain, palpitations, diaphoresis, dyspnea and peripheral edema. ; ; Respiratory: Negative for cough, wheezing and stridor. ; ; Gastrointestinal: Negative for nausea, vomiting, diarrhea, abdominal pain, blood in stool, hematemesis, jaundice and rectal bleeding. . ; ; Genitourinary: +dysuria, urinary frequency. Negative for flank pain and hematuria.;; GYN:  No vaginal bleeding, +vaginal discharge, no vulvar pain.;; Musculoskeletal: Negative for back pain and neck pain. Negative for swelling and trauma.; ; Skin: Negative for pruritus, rash, abrasions, blisters, bruising and skin lesion.; ; Neuro: Negative for headache, lightheadedness and neck stiffness. Negative for weakness, altered level of consciousness , altered mental status, extremity weakness, paresthesias, involuntary movement, seizure and syncope.     Allergies  Bactrim  Home Medications   Current Outpatient Rx  Name  Route  Sig  Dispense  Refill  . acetaminophen (TYLENOL) 500 MG tablet   Oral   Take 500 mg by mouth every 6 (six) hours as needed for pain.         . miconazole (MICOTIN) 2 % cream   Topical   Apply 1 application topically 2 (two) times daily as needed (itch).         Marland Kitchen  traMADol (ULTRAM) 50 MG tablet   Oral   Take 2 tablets (100 mg total) by mouth every 8 (eight) hours as needed for pain.   30 tablet   0   . vitamin E 400 UNIT capsule   Oral   Take 400 Units by mouth every morning.         . cephALEXin (KEFLEX) 500 MG capsule   Oral   Take 1 capsule (500 mg total) by mouth 3 (three) times daily.   30 capsule   0   . metroNIDAZOLE (FLAGYL) 500 MG tablet   Oral   Take 1 tablet (500 mg total) by mouth 2 (two) times daily.   14 tablet   0    BP 152/95  Pulse 93  Temp(Src) 99.2 F (37.3 C) (Oral)  Resp 18  SpO2 98%  LMP 02/26/2013 Physical Exam 2215: Physical examination:   Nursing notes reviewed; Vital signs and O2 SAT reviewed;  Constitutional: Well developed, Well nourished, Well hydrated, In no acute distress; Head:  Normocephalic, atraumatic; Eyes: EOMI, PERRL, No scleral icterus; ENMT: Mouth and pharynx normal, Mucous membranes moist; Neck: Supple, Full range of motion, No lymphadenopathy; Cardiovascular: Regular rate and rhythm, No murmur, rub, or gallop; Respiratory: Breath sounds clear & equal bilaterally, No rales, rhonchi, wheezes.  Speaking full sentences with ease, Normal respiratory effort/excursion; Chest: Nontender, Movement normal; Abdomen: Soft, Nontender, Nondistended, Normal bowel sounds; Genitourinary: No CVA tenderness. Pelvic exam performed with permission of pt and female ED tech assist during exam.  External genitalia w/o lesions. Vaginal vault with thick white discharge.  Cervix w/o lesions, not friable, GC/chlam and wet prep obtained and sent to lab.  Bimanual exam w/o CMT, uterine or adnexal tenderness.;; Extremities: Pulses normal, No tenderness, No edema, No calf edema or asymmetry.; Neuro: AA&Ox3, Major CN grossly intact.  Speech clear. Climbs on and off stretcher easily by herself. Gait steady. No gross focal motor or sensory deficits in extremities.; Skin: Color normal, Warm, Dry.   ED Course  Procedures     MDM  MDM Reviewed: previous chart, nursing note and vitals Reviewed previous: labs Interpretation: labs     Results for orders placed during the hospital encounter of 04/06/13  WET PREP, GENITAL      Result Value Range   Yeast Wet Prep HPF POC NONE SEEN  NONE SEEN   Trich, Wet Prep NONE SEEN  NONE SEEN   Clue Cells Wet Prep HPF POC NONE SEEN  NONE SEEN   WBC, Wet Prep HPF POC TOO NUMEROUS TO COUNT (*) NONE SEEN  URINALYSIS W MICROSCOPIC + REFLEX CULTURE      Result Value Range   Color, Urine AMBER (*) YELLOW   APPearance CLOUDY (*) CLEAR   Specific Gravity, Urine 1.022  1.005 - 1.030   pH 6.5  5.0 - 8.0   Glucose, UA  NEGATIVE  NEGATIVE mg/dL   Hgb urine dipstick LARGE (*) NEGATIVE   Bilirubin Urine NEGATIVE  NEGATIVE   Ketones, ur NEGATIVE  NEGATIVE mg/dL   Protein, ur 161 (*) NEGATIVE mg/dL   Urobilinogen, UA 1.0  0.0 - 1.0 mg/dL   Nitrite POSITIVE (*) NEGATIVE   Leukocytes, UA MODERATE (*) NEGATIVE   WBC, UA TOO NUMEROUS TO COUNT  <3 WBC/hpf   RBC / HPF TOO NUMEROUS TO COUNT  <3 RBC/hpf   Bacteria, UA MANY (*) RARE   Squamous Epithelial / LPF FEW (*) RARE  PREGNANCY, URINE      Result Value Range  Preg Test, Ur NEGATIVE  NEGATIVE    2340:  Will tx for UTI. UC is pending. Dx and testing d/w pt and family.  Questions answered.  Verb understanding, agreeable to d/c home with outpt f/u.      Laray Anger, DO 04/07/13 1318

## 2013-04-06 NOTE — ED Notes (Signed)
Pt reports being seen at Urgent Care x2 weeks ago for a UTI, placed on an ABX, began to have vaginal itching x3 days ago and took OTC medications for a yeast infection. Pt reports noticing urgency and frequency of urination again, yesterday, increasing today with blood noted to urine.

## 2013-04-07 LAB — GC/CHLAMYDIA PROBE AMP: GC Probe RNA: NEGATIVE

## 2013-04-08 LAB — URINE CULTURE

## 2013-04-09 ENCOUNTER — Telehealth (HOSPITAL_COMMUNITY): Payer: Self-pay | Admitting: Emergency Medicine

## 2013-04-09 NOTE — ED Notes (Signed)
Post ED Visit - Positive Culture Follow-up  Culture report reviewed by antimicrobial stewardship pharmacist: []  Wes Dulaney, Pharm.D., BCPS [x]  Celedonio Miyamoto, [address].D., BCPS []  Georgina Pillion, Pharm.D., BCPS []  [place], [address].D., BCPS, AAHIVP []  Estella Husk, Pharm.D., BCPS, AAHIVP  Positive urine culture Treated with Macrobid, organism sensitive to the same and no further patient follow-up is required at this time.  Kayla [name] 04/09/2013, 11:46 AM

## 2013-05-08 ENCOUNTER — Other Ambulatory Visit: Payer: Self-pay

## 2013-09-03 ENCOUNTER — Emergency Department (HOSPITAL_COMMUNITY)
Admission: EM | Admit: 2013-09-03 | Discharge: 2013-09-03 | Disposition: A | Discharge status: Home / Self Care | Payer: 59 | Attending: Emergency Medicine | Admitting: Emergency Medicine

## 2013-09-03 ENCOUNTER — Encounter (HOSPITAL_COMMUNITY): Payer: Self-pay | Admitting: Emergency Medicine

## 2013-09-03 DIAGNOSIS — R358 Other polyuria: Secondary | ICD-10-CM | POA: Insufficient documentation

## 2013-09-03 DIAGNOSIS — R3589 Other polyuria: Secondary | ICD-10-CM | POA: Insufficient documentation

## 2013-09-03 DIAGNOSIS — R350 Frequency of micturition: Secondary | ICD-10-CM | POA: Insufficient documentation

## 2013-09-03 DIAGNOSIS — R42 Dizziness and giddiness: Secondary | ICD-10-CM | POA: Insufficient documentation

## 2013-09-03 DIAGNOSIS — Z8679 Personal history of other diseases of the circulatory system: Secondary | ICD-10-CM | POA: Insufficient documentation

## 2013-09-03 DIAGNOSIS — Z8619 Personal history of other infectious and parasitic diseases: Secondary | ICD-10-CM | POA: Insufficient documentation

## 2013-09-03 DIAGNOSIS — O99210 Obesity complicating pregnancy, unspecified trimester: Secondary | ICD-10-CM

## 2013-09-03 DIAGNOSIS — Z3490 Encounter for supervision of normal pregnancy, unspecified, unspecified trimester: Secondary | ICD-10-CM

## 2013-09-03 DIAGNOSIS — E669 Obesity, unspecified: Secondary | ICD-10-CM | POA: Insufficient documentation

## 2013-09-03 DIAGNOSIS — O9989 Other specified diseases and conditions complicating pregnancy, childbirth and the puerperium: Secondary | ICD-10-CM | POA: Insufficient documentation

## 2013-09-03 DIAGNOSIS — R3915 Urgency of urination: Secondary | ICD-10-CM | POA: Insufficient documentation

## 2013-09-03 LAB — URINALYSIS, ROUTINE W REFLEX MICROSCOPIC
BILIRUBIN URINE: NEGATIVE
Glucose, UA: NEGATIVE mg/dL
HGB URINE DIPSTICK: NEGATIVE
Ketones, ur: NEGATIVE mg/dL
Leukocytes, UA: NEGATIVE
Nitrite: NEGATIVE
Protein, ur: NEGATIVE mg/dL
SPECIFIC GRAVITY, URINE: 1.016 (ref 1.005–1.030)
UROBILINOGEN UA: 0.2 mg/dL (ref 0.0–1.0)
pH: 5 (ref 5.0–8.0)

## 2013-09-03 LAB — POC URINE PREG, ED: Preg Test, Ur: POSITIVE — AB

## 2013-09-03 NOTE — ED Notes (Signed)
Pt states frequent urination for 3 weeks.  Pt also describes dizziness with feeling like she is going to pass out.  Pt states dark loose stools last week.  No fever, diarrhea, abdominal pain at this time.  Pt also states she has missed a period but this is normal for her.  Pt is having unprotected intercourse and tested 3 weeks ago which was negative.

## 2013-09-03 NOTE — ED Provider Notes (Signed)
CSN: [id number]     Arrival date & time 09/03/13  1221 History   This chart was scribed for No att. providers found by Ladona Ridgelaylor Day, ED scribe. This patient was seen in room WTR9/WTR9 and the patient's care was started at 1:44 PM.    Chief Complaint  Patient presents with  . Urinary Urgency  . Dizziness      The history is provided by the patient. No language interpreter was used.    HPI Comments: Kayla [name] is a 33 y.o. female who presents with h/o UTI's to the Emergency Department complaining of dizziness that began today. Pt reports associated polyuria that began 2 weeks ago,intermittent nausea and back pain that has since resolved. Pt denies dysuria, diarrhea, fever, cough and hematuria. Pt reports that she wants a baby. LNMP Jan 23- 26.  G2 P1   Past Medical History  Diagnosis Date  . Morbidly obese   . History of hemorrhoids   . Varicose vulva in pregnancy   . Abnormal Pap smear   . History of chlamydia   . Headache(784.0)   . History of hemorrhoids    Past Surgical History  Procedure Laterality Date  . Cholecystectomy  2007  . Ovarian cyst drainage  2003   Family History  Problem Relation Age of Onset  . Cardiomyopathy Father   . Heart failure Father   . Diabetes Father   . Hypertension Father   . Heart attack Father   . Hypertension Paternal Uncle   . Cancer Maternal Grandmother     breast and stomach  . Anesthesia problems Neg Hx   . Hypotension Neg Hx   . Malignant hyperthermia Neg Hx   . Pseudochol deficiency Neg Hx    History  Substance Use Topics  . Smoking status: Never Smoker   . Smokeless tobacco: Never Used  . Alcohol Use: No   OB History   Grav Para Term Preterm Abortions TAB SAB Ect Mult Living   1 1 1  0 0 0 0 0 0 1     Review of Systems  Constitutional: Negative for fever and chills.  Respiratory: Negative for cough and shortness of breath.   Cardiovascular: Negative for chest pain.  Gastrointestinal: Negative for abdominal  pain.  Genitourinary: Positive for frequency. Negative for dysuria, hematuria and difficulty urinating.       Polyuria  Musculoskeletal: Negative for back pain.  All other systems reviewed and are negative.       Allergies  Bactrim  Home Medications   Current Outpatient Rx  Name  Route  Sig  Dispense  Refill  . acetaminophen (TYLENOL) 500 MG tablet   Oral   Take 500 mg by mouth every 6 (six) hours as needed for pain.          LMP 07/25/2013 Physical Exam  Nursing note and vitals reviewed. Constitutional: She is oriented to person, place, and time. She appears well-developed and well-nourished. No distress.  HENT:  Head: Normocephalic and atraumatic.  Eyes: Conjunctivae and EOM are normal. Right eye exhibits no discharge. Left eye exhibits no discharge.  Neck: Normal range of motion.  Cardiovascular: Normal rate, regular rhythm and normal heart sounds.  Exam reveals no gallop.   No murmur heard. Pulmonary/Chest: Effort normal and breath sounds normal. No respiratory distress. She has no wheezes. She has no rales.  Abdominal: Soft. Bowel sounds are normal. She exhibits no distension. There is no tenderness.  Musculoskeletal: Normal range of motion. She exhibits no edema.  Neurological: She is alert and oriented to person, place, and time.  Skin: Skin is warm and dry.  Psychiatric: She has a normal mood and affect. Thought content normal.    ED Course  Procedures (including critical care time)  DIAGNOSTIC STUDIES: Oxygen Saturation is 99% on room air, normal by my interpretation.    COORDINATION OF CARE: At 1:49 PM Discussed treatment plan with patient which includes getting a OBGYN , prenatal vitamins, and labs . Patient agrees.        Labs Review Labs Reviewed  URINALYSIS, ROUTINE W REFLEX MICROSCOPIC - Abnormal; Notable for the following:    APPearance CLOUDY (*)    All other components within normal limits  POC URINE PREG, ED - Abnormal; Notable for the  following:    Preg Test, Ur POSITIVE (*)    All other components within normal limits   Imaging Review No results found.   EKG Interpretation None      MDM   Final diagnoses:  Early stage of pregnancy   Urinalysis, negative for UTI. Positive pregnancy test. Discussed early pregnancy care and teaching sheet given. Advised prenatal vitamins, rest, stay well hydrated and establish Ob/Gyn ASAP.  I personally performed the services described in this documentation, which was scribed in my presence. The recorded information has been reviewed and is accurate.     Irish Elders, NP 09/10/13 0005

## 2013-09-03 NOTE — Discharge Instructions (Signed)
Prenatal Care  °WHAT IS PRENATAL CARE?  °Prenatal care means health care during your pregnancy, before your baby is born. It is very important to take care of yourself and your baby during your pregnancy by:  °· Getting early prenatal care. If you know you are pregnant, or think you might be pregnant, call your health care provider as soon as possible. Schedule a visit for a prenatal exam. °· Getting regular prenatal care. Follow your health care provider's schedule for blood and other necessary tests. Do not miss appointments. °· Doing everything you can to keep yourself and your baby healthy during your pregnancy. °· Getting complete care. Prenatal care should include evaluation of the medical, dietary, educational, psychological, and social needs of you and your significant other. The medical and genetic history of your family and the family of your baby's father should be discussed with your health care provider. °· Discussing with your health care provider: °· Prescription, over-the-counter, and herbal medicines that you take. °· Any history of substance abuse, alcohol use, smoking, and illegal drug use. °· Any history of domestic abuse and violence. °· Immunizations you have received. °· Your nutrition and diet. °· The amount of exercise you do. °· Any environmental and occupational hazards to which you are exposed. °· History of sexually transmitted infections for both you and your partner. °· Previous pregnancies you have had. °WHY IS PRENATAL CARE SO IMPORTANT?  °By regularly seeing your health care provider, you help ensure that problems can be identified early so that they can be treated as soon as possible. Other problems might be prevented. Many studies have shown that early and regular prenatal care is important for the health of mothers and their babies.  °HOW CAN I TAKE CARE OF MYSELF WHILE I AM PREGNANT?  °Here are ways to take care of yourself and your baby:  °· Start or continue taking your  multivitamin with 400 micrograms (mcg) of folic acid every day. °· Get early and regular prenatal care. It is very important to see a health care provider during your pregnancy. Your health care provider will check at each visit to make sure that you and the baby are healthy. If there are any problems, action can be taken right away to help you and the baby. °· Eat a healthy diet that includes: °· Fruits. °· Vegetables. °· Foods low in saturated fat. °· Whole grains. °· Calcium-rich foods, such as milk, yogurt, and hard cheeses. °· Drink 6 to 8 glasses of liquids a day. °· Unless your health care provider tells you not to, try to be physically active for 30 minutes, most days of the week. If you are pressed for time, you can get your activity in through 10-minute segments, three times a day. °· Do not smoke, drink alcohol, or use drugs. These can cause long-term damage to your baby. Talk with your health care provider about steps to take to stop smoking. Talk with a member of your faith community, a counselor, a trusted friend, or your health care provider if you are concerned about your alcohol or drug use. °· Ask your health care provider before taking any medicine, even over-the-counter medicines. Some medicines are not safe to take during pregnancy. °· Get plenty of rest and sleep. °· Avoid hot tubs and saunas during pregnancy. °· Do not have X-rays taken unless absolutely necessary and with the recommendation of your health care provider. A lead shield can be placed on your abdomen to protect the   baby when X-rays are taken in other parts of the body. °· Do not empty the cat litter when you are pregnant. It may contain a parasite that causes an infection called toxoplasmosis, which can cause birth defects. Also, use gloves when working in garden areas used by cats. °· Do not eat uncooked or undercooked meats or fish. °· Do not eat soft, mold-ripened cheeses (Brie, Camembert, and chevre) or soft, blue-veined  cheese (Danish blue and Roquefort). °· Stay away from toxic chemicals like: °· Insecticides. °· Solvents (some cleaners or paint thinners). °· Lead. °· Mercury. °· Sexual intercourse may continue until the end of the pregnancy, unless you have a medical problem or there is a problem with the pregnancy and your health care provider tells you not to. °· Do not wear high-heel shoes, especially during the second half of the pregnancy. You can lose your balance and fall. °· Do not take long trips, unless absolutely necessary. Be sure to see your health care provider before going on the trip. °· Do not sit in one position for more than 2 hours when on a trip. °· Take a copy of your medical records when going on a trip. Know where a hospital is located in the city you are visiting, in case of an emergency. °· Most dangerous household products will have pregnancy warnings on their labels. Ask your health care provider about products if you are unsure. °· Limit or eliminate your caffeine intake from coffee, tea, sodas, medicines, and chocolate. °· Many women continue working through pregnancy. Staying active might help you stay healthier. If you have a question about the safety or the hours you work at your particular job, talk with your health care provider. °· Get informed: °· Read books. °· Watch videos. °· Go to childbirth classes for you and your significant other. °· Talk with experienced moms. °· Ask your health care provider about childbirth education classes for you and your partner. Classes can help you and your partner prepare for the birth of your baby. °· Ask about a baby doctor (pediatrician) and methods and pain medicine for labor, delivery, and possible cesarean delivery. °HOW OFTEN SHOULD I SEE MY HEALTH CARE PROVIDER DURING PREGNANCY?  °Your health care provider will give you a schedule for your prenatal visits. You will have visits more often as you get closer to the end of your pregnancy. An average  pregnancy lasts about 40 weeks.  °A typical schedule includes visiting your health care provider:  °· About once each month during your first 6 months of pregnancy. °· Every 2 weeks during the next 2 months. °· Weekly in the last month, until the delivery date. °Your health care provider will probably want to see you more often if: °· You are older than 35 years. °· Your pregnancy is high risk because you have certain health problems or problems with the pregnancy, such as: °· Diabetes. °· High blood pressure. °· The baby is not growing on schedule, according to the dates of the pregnancy. °Your health care provider will do special tests to make sure you and the baby are not having any serious problems. °WHAT HAPPENS DURING PRENATAL VISITS?  °· At your first prenatal visit, your health care provider will do a physical exam and talk to you about your health history and the health history of your partner and your family. Your health care provider will be able to tell you what date to expect your baby to be born on. °·   Your first physical exam will include checks of your blood pressure, measurements of your height and weight, and an exam of your pelvic organs. Your health care provider will do a Pap test if you have not had one recently and will do cultures of your cervix to make sure there is no infection.  At each prenatal visit, there will be tests of your blood, urine, blood pressure, weight, and checking the progress of the baby.  At your later prenatal visits, your health care provider will check how you are doing and how the baby is developing. You may have a number of tests done as your pregnancy progresses.  Ultrasound exams are often used to check on the baby's growth and health.  You may have more urine and blood tests, as well as special tests, if needed. These may include amniocentesis to examine fluid in the pregnancy sac, stress tests to check how the baby responds to contractions, or a  biophysical profile to measure fetus well-being. Your health care provider will explain the tests and why they are necessary.  You should discuss with your health care provider your plans to breastfeed or bottle-feed your baby.  Each visit is also a chance for you to learn about staying healthy during pregnancy and to ask questions. Document Released: 06/22/2003 Document Revised: 04/09/2013 Document Reviewed: 12/05/2012 [facility] Patient Information 2014 [place], [place].   Drink plenty of fluids Rest Do not take medications unless approved by doctor Establish and follow up with Ob/ gyn for prenatal care Take prenatal vitamins Eat small frequent meals Estimated due date 05/01/2014

## 2013-09-10 NOTE — ED Provider Notes (Signed)
Medical screening examination/treatment/procedure(s) were performed by non-physician practitioner and as supervising physician I was immediately available for consultation/collaboration.   EKG Interpretation None        David H Yao, MD 09/10/13 1059 

## 2013-10-01 LAB — OB RESULTS CONSOLE ABO/RH: RH Type: POSITIVE

## 2013-10-01 LAB — OB RESULTS CONSOLE HEPATITIS B SURFACE ANTIGEN: HEP B S AG: NEGATIVE

## 2013-10-01 LAB — OB RESULTS CONSOLE RUBELLA ANTIBODY, IGM: RUBELLA: IMMUNE

## 2013-10-01 LAB — OB RESULTS CONSOLE RPR: RPR: NONREACTIVE

## 2013-10-01 LAB — OB RESULTS CONSOLE HIV ANTIBODY (ROUTINE TESTING): HIV: NONREACTIVE

## 2013-10-01 LAB — OB RESULTS CONSOLE ANTIBODY SCREEN: ANTIBODY SCREEN: NEGATIVE

## 2014-02-02 ENCOUNTER — Other Ambulatory Visit: Payer: Self-pay

## 2014-02-05 ENCOUNTER — Other Ambulatory Visit (HOSPITAL_COMMUNITY): Payer: Self-pay | Admitting: Obstetrics and Gynecology

## 2014-02-05 DIAGNOSIS — Z3689 Encounter for other specified antenatal screening: Secondary | ICD-10-CM

## 2014-02-13 ENCOUNTER — Ambulatory Visit (HOSPITAL_COMMUNITY)
Admission: RE | Admit: 2014-02-13 | Discharge: 2014-02-13 | Disposition: A | Discharge status: Home / Self Care | Payer: 59 | Source: Ambulatory Visit | Attending: Obstetrics and Gynecology | Admitting: Obstetrics and Gynecology

## 2014-02-13 ENCOUNTER — Encounter (HOSPITAL_COMMUNITY): Payer: Self-pay

## 2014-02-13 VITALS — BP 125/65 | HR 86 | Wt 375.0 lb

## 2014-02-13 DIAGNOSIS — Z3689 Encounter for other specified antenatal screening: Secondary | ICD-10-CM

## 2014-02-13 DIAGNOSIS — O99213 Obesity complicating pregnancy, third trimester: Secondary | ICD-10-CM

## 2014-02-13 DIAGNOSIS — E669 Obesity, unspecified: Secondary | ICD-10-CM | POA: Diagnosis present

## 2014-02-13 DIAGNOSIS — IMO0002 Reserved for concepts with insufficient information to code with codable children: Secondary | ICD-10-CM | POA: Insufficient documentation

## 2014-02-13 DIAGNOSIS — O99210 Obesity complicating pregnancy, unspecified trimester: Secondary | ICD-10-CM | POA: Diagnosis not present

## 2014-04-02 LAB — OB RESULTS CONSOLE GBS: STREP GROUP B AG: POSITIVE

## 2014-04-27 ENCOUNTER — Inpatient Hospital Stay (HOSPITAL_COMMUNITY): Payer: 59 | Admitting: Anesthesiology

## 2014-04-27 ENCOUNTER — Encounter (HOSPITAL_COMMUNITY): Payer: Self-pay | Admitting: *Deleted

## 2014-04-27 ENCOUNTER — Encounter (HOSPITAL_COMMUNITY)
Admission: AD | Disposition: A | Discharge status: Home / Self Care | Payer: Self-pay | Source: Ambulatory Visit | Attending: Obstetrics & Gynecology

## 2014-04-27 ENCOUNTER — Encounter (HOSPITAL_COMMUNITY): Payer: 59 | Admitting: Anesthesiology

## 2014-04-27 ENCOUNTER — Inpatient Hospital Stay (HOSPITAL_COMMUNITY)
Admission: AD | Admit: 2014-04-27 | Discharge: 2014-04-30 | DRG: 765 | Disposition: A | Discharge status: Home / Self Care | Payer: 59 | Source: Ambulatory Visit | Attending: Obstetrics & Gynecology | Admitting: Obstetrics & Gynecology

## 2014-04-27 DIAGNOSIS — O99824 Streptococcus B carrier state complicating childbirth: Secondary | ICD-10-CM | POA: Diagnosis present

## 2014-04-27 DIAGNOSIS — Z3490 Encounter for supervision of normal pregnancy, unspecified, unspecified trimester: Secondary | ICD-10-CM

## 2014-04-27 DIAGNOSIS — Z6841 Body Mass Index (BMI) 40.0 and over, adult: Secondary | ICD-10-CM

## 2014-04-27 DIAGNOSIS — O09523 Supervision of elderly multigravida, third trimester: Secondary | ICD-10-CM

## 2014-04-27 DIAGNOSIS — O99214 Obesity complicating childbirth: Secondary | ICD-10-CM | POA: Diagnosis present

## 2014-04-27 DIAGNOSIS — Z3A39 39 weeks gestation of pregnancy: Secondary | ICD-10-CM | POA: Diagnosis present

## 2014-04-27 LAB — RPR

## 2014-04-27 LAB — CBC
HCT: 37.2 % (ref 36.0–46.0)
HCT: 31.4 % — ABNORMAL LOW (ref 36.0–46.0)
Hemoglobin: 12.9 g/dL (ref 12.0–15.0)
Hemoglobin: 10.8 g/dL — ABNORMAL LOW (ref 12.0–15.0)
MCH: 31.5 pg (ref 26.0–34.0)
MCH: 31.3 pg (ref 26.0–34.0)
MCHC: 34.7 g/dL (ref 30.0–36.0)
MCHC: 34.4 g/dL (ref 30.0–36.0)
MCV: 90.7 fL (ref 78.0–100.0)
MCV: 91 fL (ref 78.0–100.0)
PLATELETS: 205 10*3/uL (ref 150–400)
PLATELETS: 183 10*3/uL (ref 150–400)
RBC: 4.1 MIL/uL (ref 3.87–5.11)
RBC: 3.45 MIL/uL — ABNORMAL LOW (ref 3.87–5.11)
RDW: 13.6 % (ref 11.5–15.5)
RDW: 13.5 % (ref 11.5–15.5)
WBC: 4.8 10*3/uL (ref 4.0–10.5)
WBC: 10.8 10*3/uL — AB (ref 4.0–10.5)

## 2014-04-27 LAB — TYPE AND SCREEN
ABO/RH(D): O POS
Antibody Screen: NEGATIVE

## 2014-04-27 LAB — CREATININE, SERUM
Creatinine, Ser: 0.99 mg/dL (ref 0.50–1.10)
GFR calc Af Amer: 85 mL/min — ABNORMAL LOW (ref >90)
GFR calc non Af Amer: 73 mL/min — ABNORMAL LOW (ref >90)

## 2014-04-27 SURGERY — Surgical Case
Anesthesia: Epidural

## 2014-04-27 MED ORDER — EPHEDRINE 5 MG/ML INJ: 10.0000 mg | INTRAVENOUS | Status: DC | PRN

## 2014-04-27 MED ORDER — PENICILLIN G POTASSIUM 5000000 UNITS IJ SOLR
5.0000 10*6.[IU] | Freq: Once | INTRAMUSCULAR | Status: AC
  Administered 2014-04-27: 5 10*6.[IU] via INTRAVENOUS
  Filled 2014-04-27: qty 5

## 2014-04-27 MED ORDER — AMPICILLIN SODIUM 2 G IJ SOLR
2.0000 g | Freq: Once | INTRAMUSCULAR | Status: AC
  Administered 2014-04-27: 2 g via INTRAVENOUS
  Filled 2014-04-27: qty 2000

## 2014-04-27 MED ORDER — BISACODYL 10 MG RE SUPP: 10.0000 mg | Freq: Every day | RECTAL | Status: DC | PRN

## 2014-04-27 MED ORDER — FLEET ENEMA 7-19 GM/118ML RE ENEM: 1.0000 | ENEMA | Freq: Every day | RECTAL | Status: DC | PRN

## 2014-04-27 MED ORDER — PHENYLEPHRINE 40 MCG/ML (10ML) SYRINGE FOR IV PUSH (FOR BLOOD PRESSURE SUPPORT)
80.0000 ug | PREFILLED_SYRINGE | INTRAVENOUS | Status: AC | PRN
  Administered 2014-04-27 (×3): 80 ug via INTRAVENOUS

## 2014-04-27 MED ORDER — ERYTHROMYCIN 5 MG/GM OP OINT
TOPICAL_OINTMENT | OPHTHALMIC | Status: AC
  Filled 2014-04-27: qty 1

## 2014-04-27 MED ORDER — PENICILLIN G POTASSIUM 5000000 UNITS IJ SOLR
2.5000 10*6.[IU] | INTRAVENOUS | Status: DC
  Filled 2014-04-27 (×4): qty 2.5

## 2014-04-27 MED ORDER — MEPERIDINE HCL 25 MG/ML IJ SOLN: 6.2500 mg | INTRAMUSCULAR | Status: DC | PRN

## 2014-04-27 MED ORDER — DIPHENHYDRAMINE HCL 25 MG PO CAPS
25.0000 mg | ORAL_CAPSULE | ORAL | Status: DC | PRN
  Administered 2014-04-29: 25 mg via ORAL
  Filled 2014-04-27 (×2): qty 1

## 2014-04-27 MED ORDER — SIMETHICONE 80 MG PO CHEW
80.0000 mg | CHEWABLE_TABLET | Freq: Three times a day (TID) | ORAL | Status: DC
  Administered 2014-04-28 – 2014-04-30 (×7): 80 mg via ORAL
  Filled 2014-04-27 (×7): qty 1

## 2014-04-27 MED ORDER — SODIUM CHLORIDE 0.9 % IJ SOLN: 3.0000 mL | INTRAMUSCULAR | Status: DC | PRN

## 2014-04-27 MED ORDER — DIBUCAINE 1 % RE OINT: 1.0000 "application " | TOPICAL_OINTMENT | RECTAL | Status: DC | PRN

## 2014-04-27 MED ORDER — NALBUPHINE HCL 10 MG/ML IJ SOLN: 5.0000 mg | Freq: Once | INTRAMUSCULAR | Status: AC | PRN

## 2014-04-27 MED ORDER — BUPIVACAINE HCL (PF) 0.25 % IJ SOLN
INTRAMUSCULAR | Status: DC | PRN
  Administered 2014-04-27 (×2): 3 mL via EPIDURAL

## 2014-04-27 MED ORDER — NALBUPHINE HCL 10 MG/ML IJ SOLN
5.0000 mg | INTRAMUSCULAR | Status: DC | PRN
  Administered 2014-04-28: 5 mg via INTRAVENOUS
  Filled 2014-04-27: qty 1

## 2014-04-27 MED ORDER — ZOLPIDEM TARTRATE 5 MG PO TABS: 5.0000 mg | ORAL_TABLET | Freq: Every evening | ORAL | Status: DC | PRN

## 2014-04-27 MED ORDER — VITAMIN K1 1 MG/0.5ML IJ SOLN
INTRAMUSCULAR | Status: AC
  Filled 2014-04-27: qty 0.5

## 2014-04-27 MED ORDER — ONDANSETRON HCL 4 MG PO TABS: 4.0000 mg | ORAL_TABLET | ORAL | Status: DC | PRN

## 2014-04-27 MED ORDER — NALBUPHINE HCL 10 MG/ML IJ SOLN: 5.0000 mg | INTRAMUSCULAR | Status: DC | PRN

## 2014-04-27 MED ORDER — SODIUM CHLORIDE 0.9 % IV SOLN: 250.0000 mL | INTRAVENOUS | Status: DC

## 2014-04-27 MED ORDER — PRENATAL MULTIVITAMIN CH
1.0000 | ORAL_TABLET | Freq: Every day | ORAL | Status: DC
  Administered 2014-04-28 – 2014-04-29 (×2): 1 via ORAL
  Filled 2014-04-27 (×2): qty 1

## 2014-04-27 MED ORDER — FLEET ENEMA 7-19 GM/118ML RE ENEM: 1.0000 | ENEMA | RECTAL | Status: DC | PRN

## 2014-04-27 MED ORDER — FENTANYL 2.5 MCG/ML BUPIVACAINE 1/10 % EPIDURAL INFUSION (WH - ANES)
INTRAMUSCULAR | Status: DC | PRN
  Administered 2014-04-27: 14 mL/h via EPIDURAL

## 2014-04-27 MED ORDER — LACTATED RINGERS IV SOLN
INTRAVENOUS | Status: DC
  Administered 2014-04-27: 11:00:00 via INTRAVENOUS

## 2014-04-27 MED ORDER — SCOPOLAMINE 1 MG/3DAYS TD PT72
1.0000 | MEDICATED_PATCH | Freq: Once | TRANSDERMAL | Status: DC
  Administered 2014-04-27: 1.5 mg via TRANSDERMAL

## 2014-04-27 MED ORDER — KETOROLAC TROMETHAMINE 30 MG/ML IJ SOLN
INTRAMUSCULAR | Status: AC
  Filled 2014-04-27: qty 1

## 2014-04-27 MED ORDER — DEXTROSE 5 % IV SOLN
2.0000 g | Freq: Two times a day (BID) | INTRAVENOUS | Status: DC
  Administered 2014-04-27: 2 g via INTRAVENOUS
  Filled 2014-04-27 (×2): qty 2

## 2014-04-27 MED ORDER — TETANUS-DIPHTH-ACELL PERTUSSIS 5-2.5-18.5 LF-MCG/0.5 IM SUSP: 0.5000 mL | Freq: Once | INTRAMUSCULAR | Status: DC

## 2014-04-27 MED ORDER — FENTANYL CITRATE 0.05 MG/ML IJ SOLN
25.0000 ug | INTRAMUSCULAR | Status: DC | PRN
  Administered 2014-04-27 (×2): 50 ug via INTRAVENOUS

## 2014-04-27 MED ORDER — FENTANYL 2.5 MCG/ML BUPIVACAINE 1/10 % EPIDURAL INFUSION (WH - ANES)
14.0000 mL/h | INTRAMUSCULAR | Status: DC | PRN
  Administered 2014-04-27: 12 mL/h via EPIDURAL
  Administered 2014-04-27 (×2): 14 mL/h via EPIDURAL
  Filled 2014-04-27 (×3): qty 125

## 2014-04-27 MED ORDER — OXYTOCIN 10 UNIT/ML IJ SOLN
INTRAMUSCULAR | Status: AC
  Filled 2014-04-27: qty 4

## 2014-04-27 MED ORDER — PHENYLEPHRINE 40 MCG/ML (10ML) SYRINGE FOR IV PUSH (FOR BLOOD PRESSURE SUPPORT)
80.0000 ug | PREFILLED_SYRINGE | INTRAVENOUS | Status: DC | PRN
  Filled 2014-04-27: qty 10

## 2014-04-27 MED ORDER — ONDANSETRON HCL 4 MG/2ML IJ SOLN: 4.0000 mg | Freq: Four times a day (QID) | INTRAMUSCULAR | Status: DC | PRN

## 2014-04-27 MED ORDER — OXYCODONE-ACETAMINOPHEN 5-325 MG PO TABS: 1.0000 | ORAL_TABLET | ORAL | Status: DC | PRN

## 2014-04-27 MED ORDER — MENTHOL 3 MG MT LOZG: 1.0000 | LOZENGE | OROMUCOSAL | Status: DC | PRN

## 2014-04-27 MED ORDER — IBUPROFEN 800 MG PO TABS
800.0000 mg | ORAL_TABLET | Freq: Three times a day (TID) | ORAL | Status: DC | PRN
  Administered 2014-04-28 – 2014-04-30 (×7): 800 mg via ORAL
  Filled 2014-04-27 (×7): qty 1

## 2014-04-27 MED ORDER — NALOXONE HCL 0.4 MG/ML IJ SOLN: 0.4000 mg | INTRAMUSCULAR | Status: DC | PRN

## 2014-04-27 MED ORDER — SODIUM BICARBONATE 8.4 % IV SOLN
INTRAVENOUS | Status: DC | PRN
  Administered 2014-04-27 (×2): 5 mL via EPIDURAL

## 2014-04-27 MED ORDER — ENOXAPARIN SODIUM 40 MG/0.4ML ~~LOC~~ SOLN
40.0000 mg | SUBCUTANEOUS | Status: DC
  Administered 2014-04-28 – 2014-04-29 (×2): 40 mg via SUBCUTANEOUS
  Filled 2014-04-27 (×2): qty 0.4

## 2014-04-27 MED ORDER — LANOLIN HYDROUS EX OINT: 1.0000 "application " | TOPICAL_OINTMENT | CUTANEOUS | Status: DC | PRN

## 2014-04-27 MED ORDER — ONDANSETRON HCL 4 MG/2ML IJ SOLN
INTRAMUSCULAR | Status: DC | PRN
  Administered 2014-04-27: 4 mg via INTRAVENOUS

## 2014-04-27 MED ORDER — LACTATED RINGERS IV SOLN
INTRAVENOUS | Status: DC | PRN
  Administered 2014-04-27 (×4): via INTRAVENOUS

## 2014-04-27 MED ORDER — SIMETHICONE 80 MG PO CHEW
80.0000 mg | CHEWABLE_TABLET | ORAL | Status: DC
  Administered 2014-04-27 – 2014-04-29 (×3): 80 mg via ORAL
  Filled 2014-04-27 (×3): qty 1

## 2014-04-27 MED ORDER — OXYTOCIN 40 UNITS IN LACTATED RINGERS INFUSION - SIMPLE MED: 62.5000 mL/h | INTRAVENOUS | Status: DC

## 2014-04-27 MED ORDER — DEXTROSE 5 % IV SOLN: 2.0000 g | Freq: Two times a day (BID) | INTRAVENOUS | Status: DC

## 2014-04-27 MED ORDER — DIPHENHYDRAMINE HCL 50 MG/ML IJ SOLN: 12.5000 mg | INTRAMUSCULAR | Status: DC | PRN

## 2014-04-27 MED ORDER — OXYCODONE-ACETAMINOPHEN 5-325 MG PO TABS
1.0000 | ORAL_TABLET | ORAL | Status: DC | PRN
  Administered 2014-04-29: 1 via ORAL
  Filled 2014-04-27: qty 1

## 2014-04-27 MED ORDER — SENNOSIDES-DOCUSATE SODIUM 8.6-50 MG PO TABS
2.0000 | ORAL_TABLET | ORAL | Status: DC
  Administered 2014-04-27 – 2014-04-29 (×3): 2 via ORAL
  Filled 2014-04-27 (×3): qty 2

## 2014-04-27 MED ORDER — FENTANYL CITRATE 0.05 MG/ML IJ SOLN
INTRAMUSCULAR | Status: AC
  Administered 2014-04-27: 50 ug via INTRAVENOUS
  Filled 2014-04-27: qty 2

## 2014-04-27 MED ORDER — ONDANSETRON HCL 4 MG/2ML IJ SOLN
INTRAMUSCULAR | Status: AC
  Filled 2014-04-27: qty 2

## 2014-04-27 MED ORDER — LACTATED RINGERS IV SOLN
500.0000 mL | INTRAVENOUS | Status: DC | PRN
  Administered 2014-04-27 (×4): 500 mL via INTRAVENOUS

## 2014-04-27 MED ORDER — MEASLES, MUMPS & RUBELLA VAC ~~LOC~~ INJ: 0.5000 mL | INJECTION | Freq: Once | SUBCUTANEOUS | Status: DC

## 2014-04-27 MED ORDER — LACTATED RINGERS IV SOLN
INTRAVENOUS | Status: DC | PRN
  Administered 2014-04-27: 16:00:00 via INTRAVENOUS

## 2014-04-27 MED ORDER — OXYTOCIN BOLUS FROM INFUSION: 500.0000 mL | INTRAVENOUS | Status: DC

## 2014-04-27 MED ORDER — MORPHINE SULFATE 0.5 MG/ML IJ SOLN
INTRAMUSCULAR | Status: AC
  Filled 2014-04-27: qty 10

## 2014-04-27 MED ORDER — SODIUM CHLORIDE 0.9 % IJ SOLN
3.0000 mL | Freq: Two times a day (BID) | INTRAMUSCULAR | Status: DC
Start: 2014-04-28 — End: 2014-04-30

## 2014-04-27 MED ORDER — ACETAMINOPHEN 325 MG PO TABS: 650.0000 mg | ORAL_TABLET | ORAL | Status: DC | PRN

## 2014-04-27 MED ORDER — OXYCODONE-ACETAMINOPHEN 5-325 MG PO TABS: 2.0000 | ORAL_TABLET | ORAL | Status: DC | PRN

## 2014-04-27 MED ORDER — WITCH HAZEL-GLYCERIN EX PADS: 1.0000 "application " | MEDICATED_PAD | CUTANEOUS | Status: DC | PRN

## 2014-04-27 MED ORDER — SCOPOLAMINE 1 MG/3DAYS TD PT72
MEDICATED_PATCH | TRANSDERMAL | Status: AC
  Filled 2014-04-27: qty 1

## 2014-04-27 MED ORDER — OXYCODONE-ACETAMINOPHEN 5-325 MG PO TABS
2.0000 | ORAL_TABLET | ORAL | Status: DC | PRN
  Administered 2014-04-28 – 2014-04-30 (×5): 2 via ORAL
  Filled 2014-04-27 (×5): qty 2

## 2014-04-27 MED ORDER — ONDANSETRON HCL 4 MG/2ML IJ SOLN: 4.0000 mg | Freq: Three times a day (TID) | INTRAMUSCULAR | Status: DC | PRN

## 2014-04-27 MED ORDER — OXYTOCIN 40 UNITS IN LACTATED RINGERS INFUSION - SIMPLE MED: 62.5000 mL/h | INTRAVENOUS | Status: AC

## 2014-04-27 MED ORDER — DIPHENHYDRAMINE HCL 25 MG PO CAPS: 25.0000 mg | ORAL_CAPSULE | Freq: Four times a day (QID) | ORAL | Status: DC | PRN

## 2014-04-27 MED ORDER — OXYTOCIN 10 UNIT/ML IJ SOLN
40.0000 [IU] | INTRAVENOUS | Status: DC | PRN
  Administered 2014-04-27: 40 [IU] via INTRAVENOUS

## 2014-04-27 MED ORDER — KETOROLAC TROMETHAMINE 30 MG/ML IJ SOLN
30.0000 mg | Freq: Four times a day (QID) | INTRAMUSCULAR | Status: AC | PRN
  Administered 2014-04-27: 30 mg via INTRAVENOUS
  Filled 2014-04-27: qty 1

## 2014-04-27 MED ORDER — SIMETHICONE 80 MG PO CHEW: 80.0000 mg | CHEWABLE_TABLET | ORAL | Status: DC | PRN

## 2014-04-27 MED ORDER — MORPHINE SULFATE (PF) 0.5 MG/ML IJ SOLN
INTRAMUSCULAR | Status: DC | PRN
  Administered 2014-04-27: 4 mg via EPIDURAL

## 2014-04-27 MED ORDER — LACTATED RINGERS IV SOLN
500.0000 mL | Freq: Once | INTRAVENOUS | Status: AC
  Administered 2014-04-27: 500 mL via INTRAVENOUS

## 2014-04-27 MED ORDER — LIDOCAINE-EPINEPHRINE (PF) 2 %-1:200000 IJ SOLN
INTRAMUSCULAR | Status: DC | PRN
  Administered 2014-04-27: 3 mL

## 2014-04-27 MED ORDER — CITRIC ACID-SODIUM CITRATE 334-500 MG/5ML PO SOLN
30.0000 mL | ORAL | Status: DC | PRN
  Administered 2014-04-27: 30 mL via ORAL
  Filled 2014-04-27: qty 15

## 2014-04-27 MED ORDER — KETOROLAC TROMETHAMINE 30 MG/ML IJ SOLN: 30.0000 mg | Freq: Four times a day (QID) | INTRAMUSCULAR | Status: AC | PRN

## 2014-04-27 MED ORDER — LIDOCAINE HCL (PF) 1 % IJ SOLN: 30.0000 mL | INTRAMUSCULAR | Status: DC | PRN

## 2014-04-27 MED ORDER — DEXTROSE 5 % IV SOLN: 1.0000 ug/kg/h | INTRAVENOUS | Status: DC | PRN

## 2014-04-27 MED ORDER — ONDANSETRON HCL 4 MG/2ML IJ SOLN: 4.0000 mg | INTRAMUSCULAR | Status: DC | PRN

## 2014-04-27 SURGICAL SUPPLY — 25 items
BENZOIN TINCTURE PRP APPL 2/3 (GAUZE/BANDAGES/DRESSINGS) ×2 IMPLANT
CLAMP CORD UMBIL (MISCELLANEOUS) IMPLANT
CLOTH BEACON ORANGE TIMEOUT ST (SAFETY) ×2 IMPLANT
DRAPE SHEET LG 3/4 BI-LAMINATE (DRAPES) IMPLANT
DRSG OPSITE POSTOP 4X10 (GAUZE/BANDAGES/DRESSINGS) ×2 IMPLANT
DURAPREP 26ML APPLICATOR (WOUND CARE) ×2 IMPLANT
ELECT REM PT RETURN 9FT ADLT (ELECTROSURGICAL) ×2
ELECTRODE REM PT RTRN 9FT ADLT (ELECTROSURGICAL) ×1 IMPLANT
EXTRACTOR VACUUM M CUP 4 TUBE (SUCTIONS) IMPLANT
GLOVE BIO SURGEON STRL SZ7 (GLOVE) ×2 IMPLANT
GOWN STRL REUS W/TWL LRG LVL3 (GOWN DISPOSABLE) ×4 IMPLANT
KIT ABG SYR 3ML LUER SLIP (SYRINGE) IMPLANT
NEEDLE HYPO 25X5/8 SAFETYGLIDE (NEEDLE) ×2 IMPLANT
NS IRRIG 1000ML POUR BTL (IV SOLUTION) ×2 IMPLANT
PACK C SECTION WH (CUSTOM PROCEDURE TRAY) ×2 IMPLANT
PAD OB MATERNITY 4.3X12.25 (PERSONAL CARE ITEMS) ×2 IMPLANT
STRIP CLOSURE SKIN 1/2X4 (GAUZE/BANDAGES/DRESSINGS) ×2 IMPLANT
SUT CHROMIC 0 CTX 36 (SUTURE) ×6 IMPLANT
SUT MON AB 4-0 PS1 27 (SUTURE) ×2 IMPLANT
SUT PDS AB 0 CT1 27 (SUTURE) ×4 IMPLANT
SUT VIC AB 3-0 CT1 27 (SUTURE) ×2
SUT VIC AB 3-0 CT1 TAPERPNT 27 (SUTURE) ×2 IMPLANT
TOWEL OR 17X24 6PK STRL BLUE (TOWEL DISPOSABLE) ×2 IMPLANT
TRAY FOLEY CATH 14FR (SET/KITS/TRAYS/PACK) ×2 IMPLANT
WATER STERILE IRR 1000ML POUR (IV SOLUTION) ×2 IMPLANT

## 2014-04-27 NOTE — Progress Notes (Signed)
Pt in room, BS nurses in room x2.

## 2014-04-27 NOTE — Progress Notes (Signed)
comft after epidural, now 9/C/0 with stable FHR

## 2014-04-27 NOTE — Anesthesia Procedure Notes (Signed)
Epidural Patient location during procedure: OB Start time: 04/27/2014 6:29 AM End time: 04/27/2014 6:43 AM  Staffing Anesthesiologist: Darrie Macmillan, CHRIS Performed by: anesthesiologist   Preanesthetic Checklist Completed: patient identified, surgical consent, pre-op evaluation, timeout performed, IV checked, risks and benefits discussed and monitors and equipment checked  Epidural Patient position: sitting Prep: site prepped and draped and DuraPrep Patient monitoring: heart rate, cardiac monitor, continuous pulse ox and blood pressure Approach: midline Location: L4-L5 Injection technique: LOR saline  Needle:  Needle type: Tuohy  Needle gauge: 17 G Needle length: 9 cm Needle insertion depth: 9 cm Catheter type: closed end flexible Catheter size: 19 Gauge Catheter at skin depth: 17 cm Test dose: 2% lidocaine with Epi 1:200 K and negative  Assessment Events: blood not aspirated, injection not painful, no injection resistance, negative IV test and no paresthesia  Additional Notes H+P and labs checked, risks and benefits discussed with the patient, consent obtained, procedure tolerated well and without complications.  Reason for block:procedure for pain

## 2014-04-27 NOTE — Anesthesia Postprocedure Evaluation (Signed)
  Anesthesia Post-op Note  Patient: Kayla [name]  Procedure(s) Performed: Procedure(s): CESAREAN SECTION (N/A)  Patient Location: PACU  Anesthesia Type:Epidural  Level of Consciousness: awake, alert  and oriented  Airway and Oxygen Therapy: Patient Spontanous Breathing  Post-op Pain: none  Post-op Assessment: Post-op Vital signs reviewed, Patient's Cardiovascular Status Stable, Respiratory Function Stable, Patent Airway, No signs of Nausea or vomiting, Pain level controlled, No headache, No backache, No residual numbness and No residual motor weakness  Post-op Vital Signs: Reviewed and stable  Last Vitals:  Filed Vitals:   04/27/14 1815  BP:   Pulse:   Temp:   Resp: 6    Complications: No apparent anesthesia complications

## 2014-04-27 NOTE — H&P (Signed)
Gwenlyn L. Lakey  DICTATION # [id number] CSN# [id number]   Meriel PicaHOLLAND,Makylah Bossard M, MD 04/27/2014 8:53 AM

## 2014-04-27 NOTE — Op Note (Signed)
Kynsleigh L. Vilardi  DICTATION # [id number] CSN# [id number]   Meriel PicaHOLLAND,Tamella Tuccillo M, MD 04/27/2014 4:51 PM

## 2014-04-27 NOTE — Progress Notes (Signed)
Dr Marcelle OverlieHolland updated on blood pressures now more normalized with epidural paused for 30 minutes and IV fluid boluses. Updated on anesthesia MD managing pt. Updated on last sve exam after pt feeling pressure. Updated on some difficulty tracing uc's but appear q 1-2 minutes with new toco and palpation. Updated on fhr with minimal variability and late decels and vbl decels resolved once b/p stabilized. No new orders at this time. No new antibiotic orders at this time-anticipate delivery before antibiotic due per md.

## 2014-04-27 NOTE — Transfer of Care (Signed)
Immediate Anesthesia Transfer of Care Note  Patient: Kayla [name]  Procedure(s) Performed: Procedure(s): CESAREAN SECTION (N/A)  Patient Location: PACU  Anesthesia Type:Epidural  Level of Consciousness: awake, alert  and oriented  Airway & Oxygen Therapy: Patient Spontanous Breathing  Post-op Assessment: Report given to PACU RN and Post -op Vital signs reviewed and stable  Post vital signs: Reviewed and stable  Complications: No apparent anesthesia complications

## 2014-04-27 NOTE — H&P (Signed)
NAMIsabella [name]:  [place], [name]           ACCOUNT NO.:  [id number]  MEDICAL RECORD NO.:  [id number]  LOCATION:                                 FACILITY:  PHYSICIAN:  Duke Salviaichard M. Marcelle OverlieHolland, M.D.    DATE OF BIRTH:  DATE OF ADMISSION:  04/27/2014 DATE OF DISCHARGE:                             HISTORY & PHYSICAL   CHIEF COMPLAINT:  Labor.  HPI:  A 33 year old, G2, P1-0-0-1, presents at term with labor. Prenatal course, she had a normal Panorama drawn.  1-hour GTT was 122. Her pregnancy has been otherwise uncomplicated.  PAST MEDICAL HISTORY:  Allergies to Bactrim.  OBSTETRICAL HISTORY:  2012 delivering a 7 pounds and 6 ounce female vaginally with an epidural.  The remainder of her medical history, family history, and social history, please see the [place] form.  PHYSICAL EXAM:  VITAL SIGNS:  Temp 98.2, blood pressure 130/78. HEENT: Unremarkable. NECK:  Supple without masses. LUNGS:  Clear. CARDIOVASCULAR:  Regular rate and rhythm without murmurs, rubs, or gallops. BREASTS:  Not examined. PELVIC:  Fundal height was term, difficult to ascertain due to her weight, cervix non-complete ruptured, noted to be vertex. EXTREMITIES:  Unremarkable. NEUROLOGIC:  Unremarkable.  IMPRESSION: 1. Term pregnancy, labor, normal Panorama screen early pregnancy,     normal GTT. 2. Obesity.  PLAN:  Anticipate vaginal delivery, patient requests epidural.     Duke Salviaichard M. Marcelle OverlieHolland, M.D.     RMH/MEDQ  D:  04/27/2014  T:  04/27/2014  Job:  [id number]

## 2014-04-27 NOTE — Progress Notes (Signed)
CTSP re decr BTB/asses labor pattern, IUPC placed with leak clr AF, + accel with scalp stim, ctx pattern appears adeq, now 9/C/ still zero station

## 2014-04-27 NOTE — Progress Notes (Signed)
adeq labor by MVU, still 9, puffy cx with vtx zero to -1>>>diascussed CS for FTP, proced + risks reviewed

## 2014-04-27 NOTE — Plan of Care (Signed)
Problem: Phase I Progression Outcomes Goal: Assess per MD/Nurse,Routine-VS,FHR,UC,Head to Toe assess Outcome: Progressing Anesthesia and RN monitoring pt's BP closely. Supporting low BP with IV fluids.

## 2014-04-27 NOTE — H&P (Signed)
Kayla [name] is a 33 y.o. female presenting for labor.  She began contracting at around 245 this AM and ROM a couple hours later.  No VB and active FM.  Antepartum course is complicated by AMA, Class III obesity, GBS positive and large fetal AC.  She is currently comfortable with epidural.    Maternal Medical History:  Reason for admission: Rupture of membranes and contractions.   Contractions: Onset was 3-5 hours ago.   Frequency: regular.   Perceived severity is moderate.    Fetal activity: Perceived fetal activity is normal.   Last perceived fetal movement was within the past hour.    Prenatal complications: no prenatal complications Prenatal Complications - Diabetes: none.    OB History   Grav Para Term Preterm Abortions TAB SAB Ect Mult Living   2 1 1  0 0 0 0 0 0 1     Past Medical History  Diagnosis Date  . Morbidly obese   . History of hemorrhoids   . Varicose vulva in pregnancy   . Abnormal Pap smear   . History of chlamydia   . Headache(784.0)   . History of hemorrhoids    Past Surgical History  Procedure Laterality Date  . Cholecystectomy  2007  . Ovarian cyst drainage  2003   Family History: family history includes Cancer in her maternal grandmother; Cardiomyopathy in her father; Diabetes in her father; Heart attack in her father; Heart failure in her father; Hypertension in her father and paternal uncle. There is no history of Anesthesia problems, Hypotension, Malignant hyperthermia, or Pseudochol deficiency. Social History:  reports that she has never smoked. She has never used smokeless tobacco. She reports that she does not drink alcohol or use illicit drugs.   Prenatal Transfer Tool  Maternal Diabetes: No Genetic Screening: Normal Maternal Ultrasounds/Referrals: Normal Fetal Ultrasounds or other Referrals:  None Maternal Substance Abuse:  No Significant Maternal Medications:  None Significant Maternal Lab Results:  Lab values include: Group B  Strep positive Other Comments:  None  ROS  Dilation: 7.5 Effacement (%): 90 Station: -2 Exam by:: Judie Petitunbar RN  Blood pressure 103/57, pulse 87, temperature 97.9 F (36.6 C), temperature source Oral, resp. rate 20, height 5\' 5"  (1.651 m), weight 375 lb (170.099 kg), last menstrual period 07/25/2013, SpO2 100.00%. Maternal Exam:  Uterine Assessment: Contraction strength is moderate.  Contraction frequency is regular.   Abdomen: Patient reports no abdominal tenderness. Fundal height is S>D.   Estimated fetal weight is 9#.       Physical Exam  Constitutional: She is oriented to person, place, and time. She appears well-developed and well-nourished.  GI: Soft. There is no rebound and no guarding.  Neurological: She is alert and oriented to person, place, and time.  Skin: Skin is warm and dry.  Psychiatric: She has a normal mood and affect. Her behavior is normal.    Prenatal labs: ABO, Rh: O/Positive/-- (04/01 0000) Antibody: Negative (04/01 0000) Rubella: Immune (04/01 0000) RPR: Nonreactive (04/01 0000)  HBsAg: Negative (04/01 0000)  HIV: Non-reactive (04/01 0000)  GBS: Positive (10/01 0000)   Assessment/Plan: 33yo G2P1001 at 9029w3d with labor -Ampicillin for GBS ppx -Anticipate SVD   Kayla [name] 04/27/2014, 6:58 AM

## 2014-04-27 NOTE — Anesthesia Preprocedure Evaluation (Addendum)
Anesthesia Evaluation  Patient identified by MRN, date of birth, ID band Patient awake    Reviewed: Allergy & Precautions, H&P , NPO status , Patient's Chart, lab work & pertinent test results  History of Anesthesia Complications Negative for: history of anesthetic complications  Airway Mallampati: III TM Distance: >3 FB Neck ROM: Full    Dental  (+) Teeth Intact   Pulmonary neg pulmonary ROS,          Cardiovascular negative cardio ROS  Rhythm:Regular     Neuro/Psych  Headaches, negative psych ROS   GI/Hepatic negative GI ROS, Neg liver ROS,   Endo/Other  Morbid obesity  Renal/GU negative Renal ROS     Musculoskeletal   Abdominal   Peds  Hematology negative hematology ROS (+)   Anesthesia Other Findings   Reproductive/Obstetrics (+) Pregnancy                           Anesthesia Physical Anesthesia Plan  ASA: III and emergent  Anesthesia Plan: Epidural   Post-op Pain Management:    Induction:   Airway Management Planned: Natural Airway  Additional Equipment:   Intra-op Plan:   Post-operative Plan:   Informed Consent: I have reviewed the patients History and Physical, chart, labs and discussed the procedure including the risks, benefits and alternatives for the proposed anesthesia with the patient or authorized representative who has indicated his/her understanding and acceptance.     Plan Discussed with: Anesthesiologist, CRNA and Surgeon  Anesthesia Plan Comments: (Patient for urgent C/Section for arrest of descent and non reassuring FHR tracing)       Anesthesia Quick Evaluation

## 2014-04-28 ENCOUNTER — Encounter (HOSPITAL_COMMUNITY): Payer: Self-pay | Admitting: Obstetrics and Gynecology

## 2014-04-28 LAB — CBC
HCT: 29.1 % — ABNORMAL LOW (ref 36.0–46.0)
Hemoglobin: 9.9 g/dL — ABNORMAL LOW (ref 12.0–15.0)
MCH: 31.1 pg (ref 26.0–34.0)
MCHC: 34 g/dL (ref 30.0–36.0)
MCV: 91.5 fL (ref 78.0–100.0)
Platelets: 188 10*3/uL (ref 150–400)
RBC: 3.18 MIL/uL — ABNORMAL LOW (ref 3.87–5.11)
RDW: 13.5 % (ref 11.5–15.5)
WBC: 10.4 10*3/uL (ref 4.0–10.5)

## 2014-04-28 MED ORDER — DEXTROSE IN LACTATED RINGERS 5 % IV SOLN
INTRAVENOUS | Status: DC
  Administered 2014-04-28: 01:00:00 via INTRAVENOUS

## 2014-04-28 NOTE — Addendum Note (Signed)
Addendum created 04/28/14 [id number] by Yolonda KidaAlison L Gilmar Bua, CRNA   Modules edited: Notes Section   Notes Section:  File: [id number]

## 2014-04-28 NOTE — Lactation Note (Signed)
This note was copied from the chart of Kayla [name]. Lactation Consultation Note  Initial visit done,  Breastfeeding consultation services and support information given to patient. Mom states baby is latching and nursing well.  She does have some concern about what baby is getting at breast..Reviewed breastfeeding basics, supply and demand.  Mom states she can hand express colostrum from one breast but not the other.  Reassured this is normal.  Mom asking for a hand pump to use to pre pump.  Encouraged to call with concerns/assist prn.  Patient Name: Kayla [name] Today's Date: 04/28/2014 Reason for consult: Initial assessment   Maternal Data Has patient been taught Hand Expression?: Yes Does the patient have breastfeeding experience prior to this delivery?: Yes  Feeding    LATCH Score/Interventions                      Lactation Tools Discussed/Used     Consult Status Consult Status: Follow-up Date: 04/29/14 Follow-up type: In-patient    Huston FoleyMOULDEN, Khian Remo S 04/28/2014, 6:29 PM

## 2014-04-28 NOTE — Progress Notes (Signed)
Subjective: Postpartum Day 1: Cesarean Delivery Patient reports tolerating PO and no problems voiding.    Objective: Vital signs in last 24 hours: Temp:  [97.8 F (36.6 C)-99 F (37.2 C)] 98.7 F (37.1 C) (10/27 0600) Pulse Rate:  [65-105] 93 (10/27 0600) Resp:  [6-23] 18 (10/27 0600) BP: (97-137)/(23-91) 117/57 mmHg (10/27 0600) SpO2:  [96 %-99 %] 98 % (10/27 0600)  Physical Exam:  General: alert and cooperative Lochia: appropriate Uterine Fundus: firm Incision: scant drainage noted on honeycomb dressing DVT Evaluation: No evidence of DVT seen on physical exam. Negative Homan's sign. No cords or calf tenderness. Calf/Ankle edema is present.   Recent Labs  04/27/14 1940 04/28/14 0558  HGB 10.8* 9.9*  HCT 31.4* 29.1*    Assessment/Plan: Status post Cesarean section. Doing well postoperatively.  Continue current care.  Kayla [name] G 04/28/2014, 8:02 AM

## 2014-04-28 NOTE — Addendum Note (Signed)
Addendum created 04/28/14 0037 by Corky Soxhris Gilberto Streck, MD   Modules edited: PRL Based Order Sets

## 2014-04-28 NOTE — Anesthesia Postprocedure Evaluation (Signed)
  Anesthesia Post-op Note  Patient: Kayla [name]  Procedure(s) Performed: Procedure(s): CESAREAN SECTION (N/A)  Patient Location: Mother/Baby  Anesthesia Type:Epidural  Level of Consciousness: awake, alert , oriented and patient cooperative  Airway and Oxygen Therapy: Patient Spontanous Breathing  Post-op Pain: mild  Post-op Assessment: Post-op Vital signs reviewed, Patient's Cardiovascular Status Stable, Respiratory Function Stable, Patent Airway, No headache, No backache, No residual numbness and No residual motor weakness  Post-op Vital Signs: Reviewed and stable  Last Vitals:  Filed Vitals:   04/28/14 0600  BP: 117/57  Pulse: 93  Temp: 37.1 C  Resp: 18    Complications: No apparent anesthesia complications

## 2014-04-28 NOTE — Op Note (Signed)
NAMIsabella Stalling:  [name], Kayla [name]           ACCOUNT NO.:  [id number]  MEDICAL RECORD NO.:  [id number]  LOCATION:  9106                          FACILITY:  [facility]  PHYSICIAN:  Duke Salviaichard M. Marcelle OverlieHolland, M.D.DATE OF BIRTH:  [birth year]  DATE OF PROCEDURE: DATE OF DISCHARGE:                              OPERATIVE REPORT   PREOPERATIVE DIAGNOSIS:  Failure to progress.  POSTOPERATIVE DIAGNOSIS:  Failure to progress plus OP presentation.  PROCEDURE:  Primary low-transverse cesarean section.  SURGEON:  Duke Salviaichard M. Marcelle OverlieHolland, M.D.  ASSISTANT:  Juluis MireJohn S. McComb, M.D.  ANESTHESIA:  Epidural.  COMPLICATIONS:  None.  DRAINS:  Foley catheter.  BLOOD LOSS:  800 mL.  PROCEDURE AND FINDINGS:  The patient was taken to the operating room after an adequate level of epidural anesthesia was obtained, the patient in left tilt position, the abdomen was prepped and draped in the usual manner for cesarean section.  Foley catheter was already positioned, draining urine.  The panniculus was elevated with tape to facilitate exposure.  Transverse incision made 2-3 fingerbreadths above the symphysis, carried down to the fascia, which was incised and extended transversely.  Rectus muscle was divided in the midline.  The peritoneum entered superiorly without incident and extended in a vertical fashion. The vesicouterine serosa was then incised and the bladder was bluntly and sharply dissected below, bladder blade repositioned.  Transverse incision was made in the lower segment, extended with blunt dissection, clear fluid noted.  The fetus was then delivered from a straight OP presentation.  The infant was suctioned, cord clamped and passed to the pediatric team for further care.  Placenta was then delivered manually and tagged.  Cord pH was sent and was pending at this dictation.  The uterine cavity was wiped clean.  Bilateral tubes and ovaries were normal.  Uterine incision was closed with a running locked 0 chromic suture  followed by an imbricating layer of 0 chromic, this was hemostatic.  The bladder flap area was intact and hemostatic.  Prior to closure, sponge and instrument counts were reported as correct x2. Peritoneum was then closed with a running 2-0 Vicryl suture.  A double- loop 0 PDS suture was then used to close the fascia transversely. Subcutaneous tissue was irrigated, noted to be hemostatic.  Dead space was closed with running 3-0 Vicryl suture, 4-0 Monocryl, subcuticular closure with Steri-Strips and a honeycomb dressing.  She tolerated this well, went to recovery room in good condition.     [name], M.D.     RMH/MEDQ  D:  04/27/2014  T:  04/28/2014  Job:  [id number]

## 2014-04-29 LAB — BIRTH TISSUE RECOVERY COLLECTION (PLACENTA DONATION)

## 2014-04-29 MED ORDER — ENOXAPARIN SODIUM 100 MG/ML ~~LOC~~ SOLN
85.0000 mg | SUBCUTANEOUS | Status: DC
  Administered 2014-04-30: 85 mg via SUBCUTANEOUS
  Filled 2014-04-29: qty 1

## 2014-04-29 NOTE — Progress Notes (Signed)
Subjective: Postpartum Day 2: Cesarean Delivery Patient reports tolerating PO, + flatus and no problems voiding.    Objective: Vital signs in last 24 hours: Temp:  [98 F (36.7 C)-99.3 F (37.4 C)] 98.8 F (37.1 C) (10/28 0557) Pulse Rate:  [66-81] 78 (10/28 0557) Resp:  [18-20] 18 (10/28 0557) BP: (107-117)/(52-62) 107/57 mmHg (10/28 0557) SpO2:  [98 %-99 %] 99 % (10/28 0557)  Physical Exam:  General: alert and cooperative Lochia: appropriate Uterine Fundus: firm Incision: small drainage noted on honeycomb dressing  DVT Evaluation: No evidence of DVT seen on physical exam. Negative Homan's sign. No cords or calf tenderness. Calf/Ankle edema is present.   Recent Labs  04/27/14 1940 04/28/14 0558  HGB 10.8* 9.9*  HCT 31.4* 29.1*    Assessment/Plan: Status post Cesarean section. Doing well postoperatively.  Continue current care.  Gala Padovano G 04/29/2014, 8:14 AM

## 2014-04-29 NOTE — Progress Notes (Signed)
Dr. Lynnette Caffey, Dr. Matthew Saras, pt states she was started on Lovenox after her C/S delivery. If she is going home on Lovenox she will need to be taught self administration.There is a kit you can order from pharmacy. The pharmacist says there is a unit dose of $Remov'80mg'HXmJDz$  available so pt. will find it easier to give rather than drawing up a certain # of milliliters.

## 2014-04-30 MED ORDER — PRENATAL MULTIVITAMIN CH: 1.0000 | ORAL_TABLET | Freq: Every day | ORAL | Status: DC

## 2014-04-30 MED ORDER — IBUPROFEN 800 MG PO TABS: 800.0000 mg | ORAL_TABLET | Freq: Three times a day (TID) | ORAL | Status: DC | PRN

## 2014-04-30 MED ORDER — OXYCODONE-ACETAMINOPHEN 5-325 MG PO TABS: 1.0000 | ORAL_TABLET | ORAL | Status: DC | PRN

## 2014-04-30 NOTE — Lactation Note (Signed)
This note was copied from the chart of Kayla [name]. Lactation Consultation Note Experienced BF, has sore nipples, comfort gels given, request DEBP to allow her nipples to rest. Mom pumping and giving colostrum in bottle and dad feeding baby. Mom states she is going to put the baby back to breast. Plans on doing both, pumping and breast feeding. Encouraged to keep baby close, cheek to breast and obtain a deep latch to prevent soreness. Mom has lots of rich colostrum. Baby sleeping had just ate. Encouraged mom to call for verification of latch next BF. Patient Name: Kayla [name] [id number]'s Date: 04/30/2014 Reason for consult: Follow-up assessment;Infant weight loss;Breast/nipple pain   Maternal Data    Feeding Feeding Type: Breast Milk  LATCH Score/Interventions          Comfort (Breast/Nipple): Filling, red/small blisters or bruises, mild/mod discomfort  Problem noted: Mild/Moderate discomfort Interventions (Mild/moderate discomfort): Comfort gels;Hand expression  Intervention(s): Breastfeeding basics reviewed;Skin to skin;Position options;Support Pillows     Lactation Tools Discussed/Used Tools: Pump;Comfort gels Breast pump type: Double-Electric Breast Pump Pump Review: Setup, frequency, and cleaning;Milk Storage Initiated by:: Arlington CalixLaura Farrell RN Date initiated:: 04/30/14   Consult Status Consult Status: Follow-up Date: 04/30/14 Follow-up type: In-patient    Charyl DancerCARVER, Kipp Shank G 04/30/2014, 5:45 AM

## 2014-04-30 NOTE — Progress Notes (Signed)
Subjective: Postpartum Day 3: Cesarean Delivery Patient reports incisional pain, tolerating PO, + flatus and no problems voiding.    Objective: Vital signs in last 24 hours: Temp:  [97.8 F (36.6 C)-97.9 F (36.6 C)] 97.9 F (36.6 C) (10/29 0500) Pulse Rate:  [68-79] 68 (10/29 0500) Resp:  [18] 18 (10/29 0500) BP: (111-122)/(61-67) 122/67 mmHg (10/29 0500)  Physical Exam:  General: alert and cooperative Lochia: appropriate Uterine Fundus: firm Incision: healing well DVT Evaluation: No evidence of DVT seen on physical exam. Negative Homan's sign. No cords or calf tenderness. Calf/Ankle edema is present.   Recent Labs  04/27/14 1940 04/28/14 0558  HGB 10.8* 9.9*  HCT 31.4* 29.1*    Assessment/Plan: Status post Cesarean section. Doing well postoperatively.  Discharge home with standard precautions and return to clinic in 1 week Patient was started on Lovenox prophylactically postpartum . Will discuss with Dr. Vincente PoliGrewal regarding continuing. Patient is ambulating well.  Jackalynn Art G 04/30/2014, 8:19 AM

## 2014-04-30 NOTE — Discharge Summary (Signed)
Obstetric Discharge Summary Reason for Admission: onset of labor Prenatal Procedures: ultrasound Intrapartum Procedures: cesarean: low cervical, transverse Postpartum Procedures: none Complications-Operative and Postpartum: none Hemoglobin  Date Value Ref Range Status  04/28/2014 9.9* 12.0 - 15.0 g/dL Final     HCT  Date Value Ref Range Status  04/28/2014 29.1* 36.0 - 46.0 % Final    Physical Exam:  General: alert, cooperative and morbidly obese Lochia: appropriate Uterine Fundus: firm Incision: healing well DVT Evaluation: No evidence of DVT seen on physical exam. Negative Homan's sign. No cords or calf tenderness. Calf/Ankle edema is present.  Discharge Diagnoses: Term Pregnancy-delivered  Discharge Information: Date: 04/30/2014 Activity: pelvic rest Diet: routine Medications: PNV, Ibuprofen and Percocet Condition: stable Instructions: refer to practice specific booklet Discharge to: home   Newborn Data: Live born female  Birth Weight: 8 lb 9 oz (3884 g) APGAR: 7, 8  Home with mother.  Derec Mozingo G 04/30/2014, 8:24 AM

## 2014-05-04 ENCOUNTER — Encounter (HOSPITAL_COMMUNITY): Payer: Self-pay | Admitting: Obstetrics and Gynecology

## 2014-06-01 ENCOUNTER — Other Ambulatory Visit: Payer: Self-pay | Admitting: Obstetrics and Gynecology

## 2014-06-02 LAB — CYTOLOGY - PAP

## 2014-09-01 ENCOUNTER — Encounter (HOSPITAL_COMMUNITY): Payer: Self-pay | Admitting: *Deleted

## 2014-09-01 ENCOUNTER — Emergency Department (HOSPITAL_COMMUNITY)
Admission: EM | Admit: 2014-09-01 | Discharge: 2014-09-01 | Disposition: A | Discharge status: Home / Self Care | Payer: 59 | Attending: Emergency Medicine | Admitting: Emergency Medicine

## 2014-09-01 DIAGNOSIS — Z3202 Encounter for pregnancy test, result negative: Secondary | ICD-10-CM | POA: Diagnosis not present

## 2014-09-01 DIAGNOSIS — Z79899 Other long term (current) drug therapy: Secondary | ICD-10-CM | POA: Diagnosis not present

## 2014-09-01 DIAGNOSIS — J029 Acute pharyngitis, unspecified: Secondary | ICD-10-CM | POA: Insufficient documentation

## 2014-09-01 DIAGNOSIS — Z8719 Personal history of other diseases of the digestive system: Secondary | ICD-10-CM | POA: Insufficient documentation

## 2014-09-01 DIAGNOSIS — R42 Dizziness and giddiness: Secondary | ICD-10-CM | POA: Diagnosis present

## 2014-09-01 DIAGNOSIS — Z8619 Personal history of other infectious and parasitic diseases: Secondary | ICD-10-CM | POA: Diagnosis not present

## 2014-09-01 DIAGNOSIS — N390 Urinary tract infection, site not specified: Secondary | ICD-10-CM

## 2014-09-01 DIAGNOSIS — H578 Other specified disorders of eye and adnexa: Secondary | ICD-10-CM | POA: Insufficient documentation

## 2014-09-01 LAB — POC URINE PREG, ED: PREG TEST UR: NEGATIVE

## 2014-09-01 LAB — URINE MICROSCOPIC-ADD ON

## 2014-09-01 LAB — URINALYSIS, ROUTINE W REFLEX MICROSCOPIC
Bilirubin Urine: NEGATIVE
Glucose, UA: NEGATIVE mg/dL
HGB URINE DIPSTICK: NEGATIVE
Ketones, ur: NEGATIVE mg/dL
Nitrite: NEGATIVE
Protein, ur: 30 mg/dL — AB
SPECIFIC GRAVITY, URINE: 1.017 (ref 1.005–1.030)
Urobilinogen, UA: 0.2 mg/dL (ref 0.0–1.0)
pH: 6 (ref 5.0–8.0)

## 2014-09-01 LAB — CBG MONITORING, ED: Glucose-Capillary: 92 mg/dL (ref 70–99)

## 2014-09-01 LAB — RAPID STREP SCREEN (MED CTR MEBANE ONLY): Streptococcus, Group A Screen (Direct): NEGATIVE

## 2014-09-01 MED ORDER — CEPHALEXIN 500 MG PO CAPS: 500.0000 mg | ORAL_CAPSULE | Freq: Two times a day (BID) | ORAL | Status: DC

## 2014-09-01 NOTE — ED Provider Notes (Signed)
CSN: [id number]     Arrival date & time 09/01/14  1041 History   First MD Initiated Contact with Patient 09/01/14 1048     Chief Complaint  Patient presents with  . Sore Throat  . Eye Drainage  . Dizziness     (Consider location/radiation/quality/duration/timing/severity/associated sxs/prior Treatment) HPI Comments: Pt states that she has had cold type symptom and sore throat. Pt states that she has also felt dizzy and the last time she felt this way she was pregnant  Patient is a 34 y.o. female presenting with pharyngitis and dizziness. The history is provided by the patient. No language interpreter was used.  Sore Throat This is a new problem. The current episode started in the past 7 days. The problem occurs constantly. The problem has been unchanged. Associated symptoms include a sore throat. Pertinent negatives include no abdominal pain, chest pain, fever or nausea. Nothing aggravates the symptoms. She has tried nothing for the symptoms.  Dizziness Associated symptoms: no chest pain, no nausea and no shortness of breath     Past Medical History  Diagnosis Date  . Morbidly obese   . History of hemorrhoids   . Varicose vulva in pregnancy   . Abnormal Pap smear   . History of chlamydia   . Headache(784.0)   . History of hemorrhoids    Past Surgical History  Procedure Laterality Date  . Cholecystectomy  2007  . Ovarian cyst drainage  2003  . Cesarean section N/A 04/27/2014    Procedure: CESAREAN SECTION;  Surgeon: Meriel Pica, MD;  Location: [facility];  Service: Obstetrics;  Laterality: N/A;   Family History  Problem Relation Age of Onset  . Cardiomyopathy Father   . Heart failure Father   . Diabetes Father   . Hypertension Father   . Heart attack Father   . Hypertension Paternal Uncle   . Cancer Maternal Grandmother     breast and stomach  . Anesthesia problems Neg Hx   . Hypotension Neg Hx   . Malignant hyperthermia Neg Hx   . Pseudochol deficiency Neg Hx     History  Substance Use Topics  . Smoking status: Never Smoker   . Smokeless tobacco: Never Used  . Alcohol Use: No   OB History    Gravida Para Term Preterm AB TAB SAB Ectopic Multiple Living   0 0 0 0 0 0 2     Review of Systems  Constitutional: Negative for fever.  HENT: Positive for sore throat.   Respiratory: Negative for shortness of breath.   Cardiovascular: Negative for chest pain.  Gastrointestinal: Negative for nausea and abdominal pain.  Neurological: Positive for dizziness.  All other systems reviewed and are negative.     Allergies  Bactrim  Home Medications   Prior to Admission medications   Medication Sig Start Date End Date Taking? Authorizing Provider  Fexofenadine HCl (MUCINEX ALLERGY PO) Take 1 tablet by mouth every 4 (four) hours as needed (congestion).   Yes Historical Provider, MD  Prenatal Vit-Fe Fumarate-FA (PRENATAL MULTIVITAMIN) TABS tablet Take 1 tablet by mouth daily at 12 noon. 04/30/14  Yes Judith Blonder, NP  ibuprofen (ADVIL,MOTRIN) 800 MG tablet Take 1 tablet (800 mg total) by mouth every 8 (eight) hours as needed for moderate pain. Patient not taking: Reported on 09/01/2014 04/30/14   Judith Blonder, NP  oxyCODONE-acetaminophen (PERCOCET/ROXICET) 5-325 MG per tablet Take 1-2 tablets by mouth every 4 (four) hours as needed (for pain scale  equal to or greater than 7). Patient not taking: Reported on 09/01/2014 04/30/14   Judith Blonderarol G Curtis, NP   BP 146/95 mmHg  Pulse 74  Temp(Src) 98 F (36.7 C) (Oral)  Resp 16  SpO2 99%  LMP 08/18/2014 Physical Exam  Constitutional: She is oriented to person, place, and time. She appears well-developed and well-nourished.  HENT:  Right Ear: External ear normal.  Left Ear: External ear normal.  Nose: Rhinorrhea present.  Mouth/Throat: Oropharyngeal exudate and posterior oropharyngeal erythema present.  Eyes: Conjunctivae and EOM are normal. Pupils are equal, round, and reactive to light.   Cardiovascular: Normal rate and regular rhythm.   Pulmonary/Chest: Effort normal and breath sounds normal.  Musculoskeletal: Normal range of motion.  Neurological: She is alert and oriented to person, place, and time. She exhibits normal muscle tone. Coordination normal.  Skin: Skin is warm and dry.  Nursing note and vitals reviewed.   ED Course  Procedures (including critical care time) Labs Review Labs Reviewed  URINALYSIS, ROUTINE W REFLEX MICROSCOPIC - Abnormal; Notable for the following:    APPearance CLOUDY (*)    Protein, ur 30 (*)    Leukocytes, UA SMALL (*)    All other components within normal limits  URINE MICROSCOPIC-ADD ON - Abnormal; Notable for the following:    Squamous Epithelial / LPF FEW (*)    Bacteria, UA FEW (*)    All other components within normal limits  RAPID STREP SCREEN  CULTURE, GROUP A STREP  POC URINE PREG, ED  CBG MONITORING, ED    Imaging Review No results found.   EKG Interpretation None      MDM   Final diagnoses:  UTI (lower urinary tract infection)    Will treat with keflex. Discussed return precautions. Pt is afebrile    Teressa LowerVrinda Luellen Howson, NP 09/01/14 1925  Layla MawKristen N Ward, DO 09/03/14 [phone]

## 2014-09-01 NOTE — ED Notes (Signed)
Pt reports sore throat and eye drainage x 2 days. Dizziness x 1 week. Pt also concerned she has low estrogen. Also wants to r/o possible pregnancy. LMP 2 weeks ago. Pregnancy test at home negative.

## 2014-09-01 NOTE — Care Management (Signed)
ED CM received call concerning antibiotic safety with breastfeeding., Reviewed patient record received keflex  Explained medication is a category B safe for breastfeeding. No further CM needs identified.

## 2014-09-01 NOTE — Discharge Instructions (Signed)
Urinary Tract Infection A urinary tract infection (UTI) can occur any place along the urinary tract. The tract includes the kidneys, ureters, bladder, and urethra. A type of germ called bacteria often causes a UTI. UTIs are often helped with antibiotic medicine.  HOME CARE   If given, take antibiotics as told by your doctor. Finish them even if you start to feel better.  Drink enough fluids to keep your pee (urine) clear or pale yellow.  Avoid tea, drinks with caffeine, and bubbly (carbonated) drinks.  Pee often. Avoid holding your pee in for a long time.  Pee before and after having sex (intercourse).  Wipe from front to back after you poop (bowel movement) if you are a woman. Use each tissue only once. GET HELP RIGHT AWAY IF:   You have back pain.  You have lower belly (abdominal) pain.  You have chills.  You feel sick to your stomach (nauseous).  You throw up (vomit).  Your burning or discomfort with peeing does not go away.  You have a fever.  Your symptoms are not better in 3 days. MAKE SURE YOU:   Understand these instructions.  Will watch your condition.  Will get help right away if you are not doing well or get worse. Document Released: 12/06/2007 Document Revised: 03/13/2012 Document Reviewed: 01/18/2012 [facility] Patient Information 2015 [place]. This information is not intended to replace advice given to you by your health care provider. Make sure you discuss any questions you have with your health care provider.  Emergency Department Resource Guide 1) Find a Doctor and Pay Out of Pocket Although you won't have to find out who is covered by your insurance plan, it is a good idea to ask around and get recommendations. You will then need to call the office and see if the doctor you have chosen will accept you as a new patient and what types of options they offer for patients who are self-pay. Some doctors offer discounts or will set up payment plans for  their patients who do not have insurance, but you will need to ask so you aren't surprised when you get to your appointment.  2) Contact Your Local Health Department Not all health departments have doctors that can see patients for sick visits, but many do, so it is worth a call to see if yours does. If you don't know where your local health department is, you can check in your phone book. The CDC also has a tool to help you locate your state's health department, and many state websites also have listings of all of their local health departments.  3) Find a Walk-in Clinic If your illness is not likely to be very severe or complicated, you may want to try a walk in clinic. These are popping up all over the country in pharmacies, drugstores, and shopping centers. They're usually staffed by nurse practitioners or physician assistants that have been trained to treat common illnesses and complaints. They're usually fairly quick and inexpensive. However, if you have serious medical issues or chronic medical problems, these are probably not your best option.  No Primary Care Doctor: - Call Health Connect at  [phone] - they can help you locate a primary care doctor that  accepts your insurance, provides certain services, etc. - Physician Referral Service- [phone]  Chronic Pain Problems: Organization         Address  Phone   Notes  [facility]  [phone] Patients need to  be referred by their primary care doctor.   Medication Assistance: Organization         Address  Phone   Notes  [facility] Medication [facility] [address] [place], [place] [address] [phone] --Must be a resident of [facility] -- Must have NO insurance coverage whatsoever (no Medicaid/ Medicare, etc.) -- The pt. MUST have a primary care doctor that directs their care regularly and follows them in the community   [facility]ist  [phone]   [employer]  [phone]    Agencies that provide inexpensive medical care: Organization         Address  Phone   Notes  [facility]  [phone]   [facility]    [phone]   [facility] [address] [place], [place] [address] [phone]   [facility] [address] [place], [place] [phone]   [facility]anned Parenthood    [phone]   [facility]    [phone]   [facility]  [address], [place] Phone:  [phone], Fax:  [phone] Hours of Operation:  9 am - 6 pm, M-F.  Also accepts Medicaid/Medicare and self-pay.  [facility] for Children  [address], [place] Phone: [phone], Fax: [phone]. Hours of Operation:  8:30 am - 5:30 pm, M-F.  Also accepts Medicaid and self-pay.  [facility] [place] [address], [place] Phone: [phone]   [facility] [address] Natasha Bence [place], [place] [phone], Ext. [phone] Mondays & Thursdays: 7-9 AM.  First 15 patients are seen on a first come, first serve basis.    Medicaid-accepting [facility] Providers:  Organization         Address  Phone   Notes  [facility] [address], [place] [phone] Also accepts self-pay patients.  [facility] [address] Laurell Josephs [place], [place]  [phone]   [facility] [address], [place] [phone]   [facility] [address], [place] [phone]   Renaye Rakers [address], [place]   [phone] Only accepts [place] patients after they have their name applied to their card.   Self-Pay (no insurance) in [facility]:  Organization         Address  Phone   Notes  Sickle Cell Patients, [facility] [address] [place], [place] [phone]   [facility] [address] [place], [place] [phone]   [facility]  [address], [place] [phone]   [facility]/Dr. Osei-Bonsu  [address] [place] or [address], [place] [phone] Phone number for both [place] and [place] locations is the same.  [facility] [address] [place] [phone]   [facility] [address] [place] or [address] [phone]   [facility] [address] [place] [phone], phone; [phone], fax Sees patients 1st and 3rd Saturday of every month.  Must not qualify for public or private insurance (i.e. Medicaid, Medicare,  Health Choice, Veterans' Benefits)  Household income should be  no more than 200% of the poverty level The clinic cannot treat you if you are pregnant or think you are pregnant  Sexually transmitted diseases are not treated at the clinic.    Dental Care: Organization         Address  Phone  Notes  [facility] [address], [place] [phone] Accepts children up to age 36 who are enrolled in [place] or [place] Health Choice; pregnant women with a Medicaid card; and children who have applied for Medicaid or [place] Health Choice, but were declined, whose parents can pay a reduced fee at time of service.  [facility] [place]  [address], [place] [phone] Accepts children up to age 36 who are enrolled in [place] or [place] Health Choice; pregnant women with a Medicaid card; and children who have applied for Medicaid or [place] Health Choice, but were declined, whose parents can pay a reduced fee at time of service.  [facility]  [address], [place] [phone] Patients are  seen by appointment only. Walk-ins are not accepted. [facility] will see patients 36 years of age and older. Monday - Tuesday (8am-5pm) Most Wednesdays (8:30-5pm) $30 per visit, cash only  [facility]  [address], [facility] [phone] Patients are seen by appointment only. Walk-ins are not accepted. [facility] will see patients 36 years of age and older. One Wednesday Evening (Monthly: Volunteer Based).  $30 per visit, cash only  [employer] [facility] [facility]  [phone] for adults; Children under age 974, call Graduate Pediatric Dentistry at [phone]. Children aged 254-14, please call [phone] to request a pediatric application.  Dental services are provided in all areas of dental care including fillings, crowns and bridges, complete and partial dentures, implants, gum treatment, root canals, and extractions. Preventive care is also provided. Treatment is provided to both adults and children. Patients are selected via a lottery and there is often a waiting list.   [facility] [address], [place]  [phone] [email or web address]   [facility] [address], [place], [place] [phone], Ext. [phone] Second and Fourth Thursday of each month, opens at 6:30 AM; Clinic ends at 9 AM.  Patients are seen on a first-come first-served basis, and a limited number are seen during each clinic.   [facility]  [address], [place], [place] [phone]   Eligibility Requirements You must have lived in [place], [place], or [place] counties for at least the last three months.   You cannot be eligible for state or federal sponsored [place] Cityhealthcare insurance, including CIGNAVeterans Administration, [place]icaid, or [employer] EntertainmentMedicare.   You generally cannot be eligible for healthcare insurance through your employer.    How to apply: Eligibility screenings are held every Tuesday and Wednesday afternoon from 1:00 pm until 4:00  pm. You do not need an appointment for the interview!  [facility] [address], [place], [place] [phone]   [facility]  [phone]   [facility]  [phone]   [facility]  [phone]    Behavioral Health Resources in the Community: Intensive Outpatient Programs Organization         Address  Phone  Notes  [facility] [name], [place], [place] [phone]  [facility] Outpatient [address], [place], [place] [phone]   [facility] [address] [place], [place]  [phone]   [facility] Mental Health [address],  [place], [place] [phone] or [phone]   Substance Abuse Resources Organization         Address  Phone  Notes  [facility]  [phone]   [facility]  [phone]   [facility] [place]  [phone]   Floydene Flock  [phone]   Residential & Outpatient Substance Abuse Program  [phone]   Psychological Services Organization         Address  Phone  Notes  [facility]  [phone]   [facility]  [phone]   [facility] Mental [facility] [address] [place] [phone] or [phone]    Mobile Crisis Teams Organization         Address  Phone  Notes  [facility], Mobile Crisis Care Unit  [phone]   [facility]  [address] [place], [place] [phone]   Doristine Locks [address] [place] [phone]    Self-Help/Support Groups Organization         Address  Phone             Notes  [facility] [place] - variety of support groups  [phone]- I7437963 Call for more information  Narcotics Anonymous (NA), [facility] [address], [employer] [place]  2 meetings at this location   Statistician          Address  Phone  Notes  [facility] [address],    [place]  [phone]   [facility]  [address] [place] [address], [place], [place] [phone]   [facility] [address] [place], [place] [phone] Admissions: 8am-3pm M-F  [facility] [address],    [place], [place] [phone]   [facility] [address] [place], [place], [place] [phone]   [facility] [address],  [place], [place] [phone]   [facility] - Intensive Outpatient [address]ure[address], [place], [place] [phone]   [facility] ([facility] [address] [place].,  [place], [place] [phone] or [phone]   [facility] ([facility]) [address] [place] [address], [place], [place] [phone] Accepts Medicaid  [facility] [address],  [place] [phone] Substance Abuse/Addiction Treatment   [facility] Organization         Address  Phone  Notes  [facility]  [phone]   Angie Fava, PhD [address] Ervin Knack [place], [place]   [phone] or [phone]   [facility] Behavioral   [address] [place], [place] [phone]   [facility] [address] [place], [place] [phone] Insurance/Medicaid/sponsorship through [facility] and Families [address]                                    [place], [place] [phone] Therapy/tele-psych/case  [facility] [address], [place] [phone]    Dr. Lolly Mustache  [phone]   [facility]  [employer] [facility] Dept. 1) [address] [place] 2) [address], [place] 3)  [place], [place] [phone]  [phone]   [facility] Child Abuse Hotline [phone] or [phone] (After Hours)

## 2014-09-03 LAB — CULTURE, GROUP A STREP

## 2014-09-06 IMAGING — US US OB DETAIL+14 WK
1 series · 12 of 28 positions shown · non-contrast
Comparison: none

[Series 1: us ob detail+14 wk · 0.22mm/px · 12 of 49 slices shown]
[im 2/49]
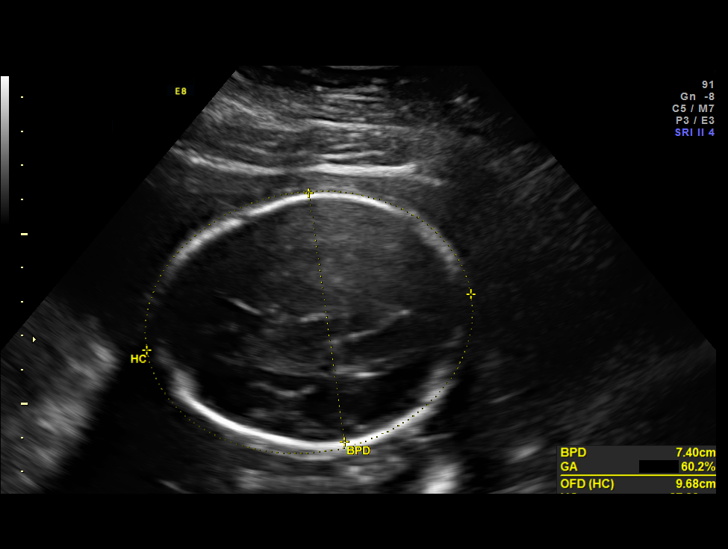
[im 6/49]
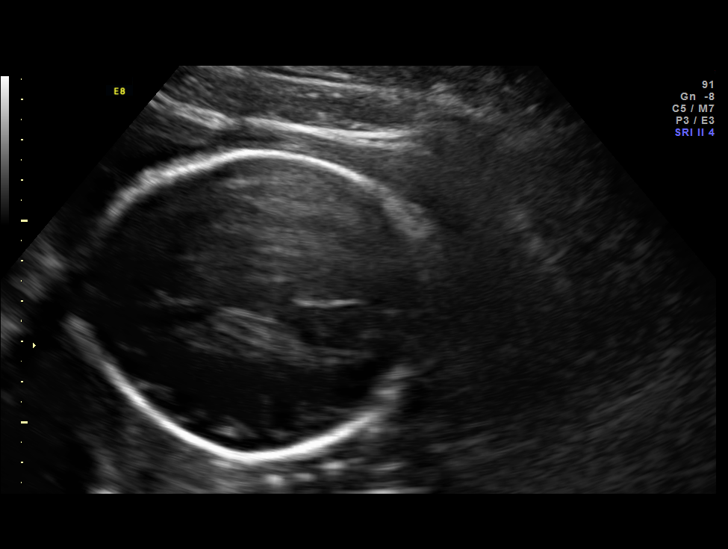
[im 9/49]
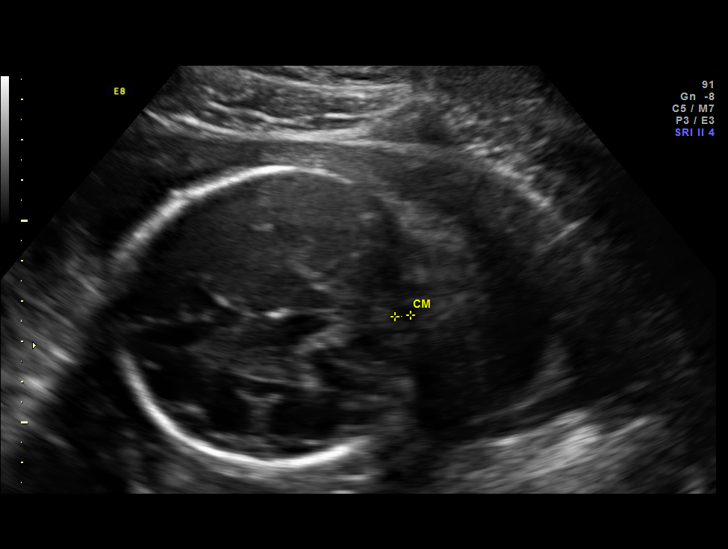
[im 15/49]
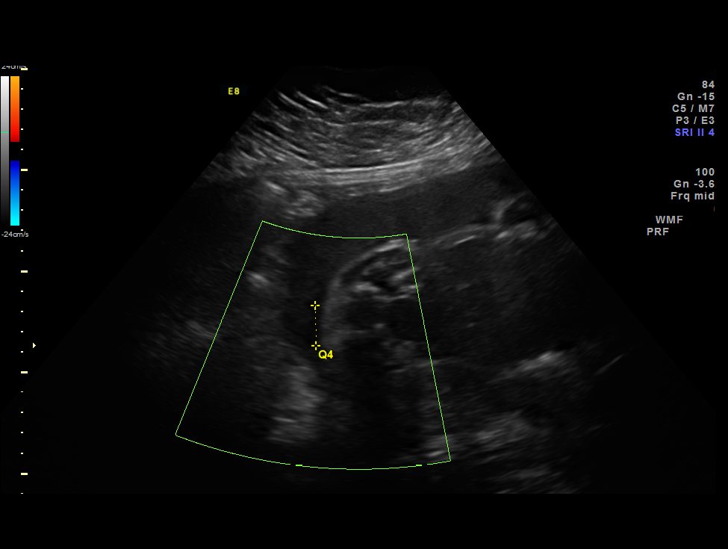
[im 18/49]
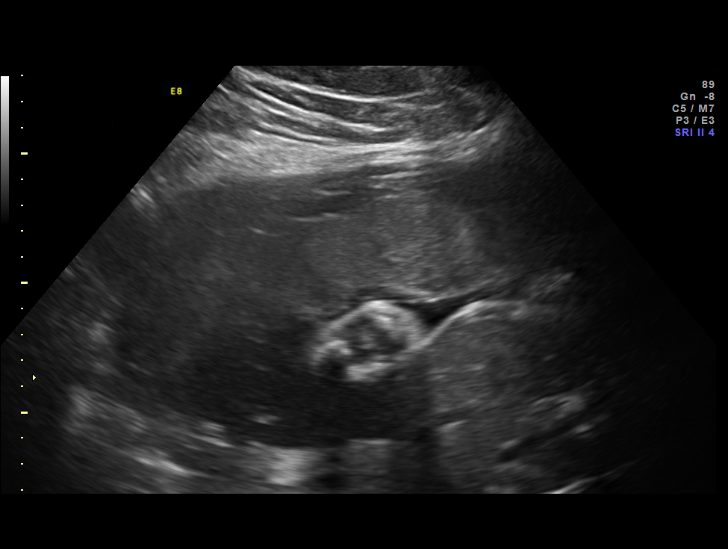
[im 22/49]
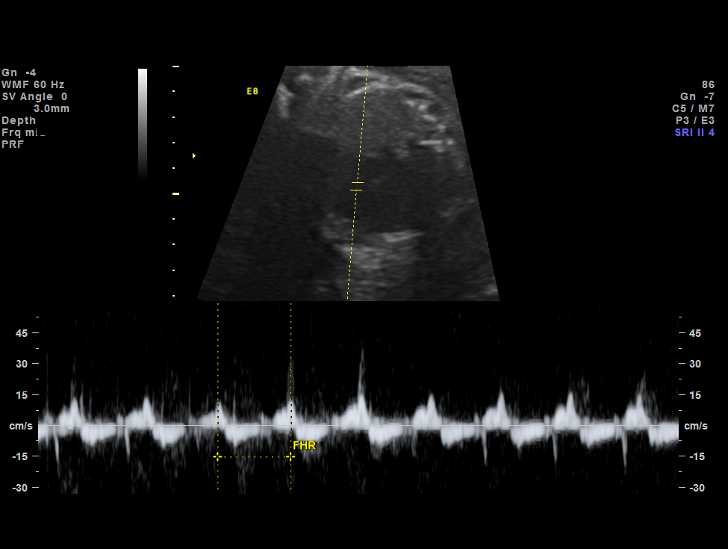
[im 27/49]
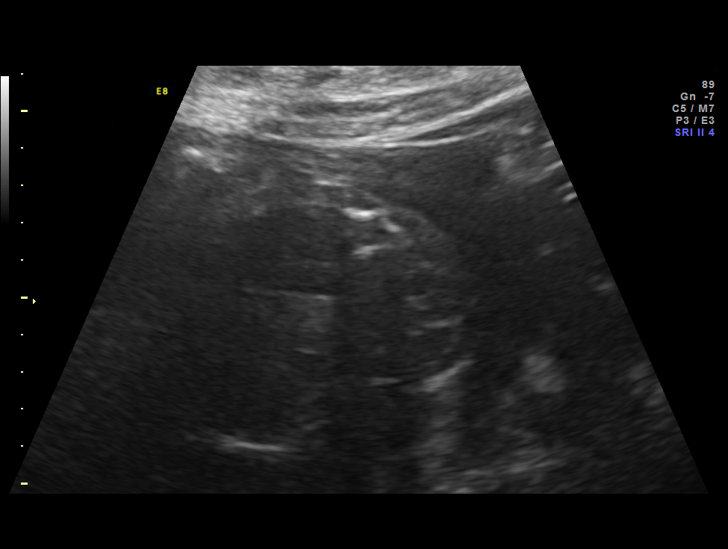
[im 31/49]
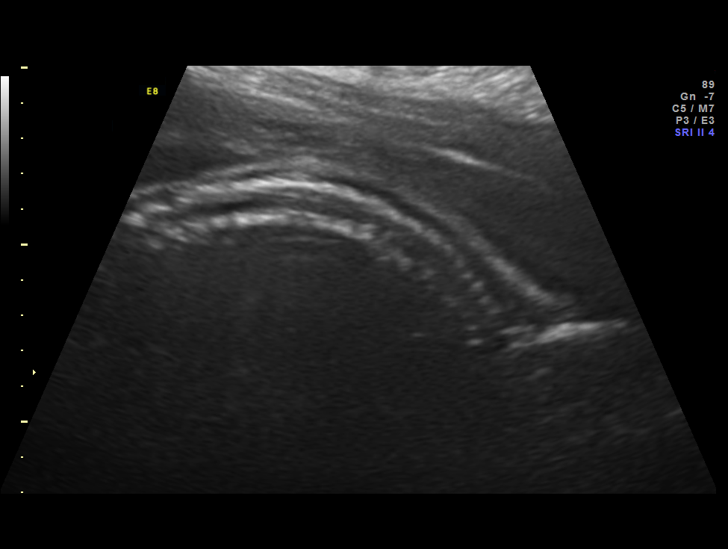
[im 34/49]
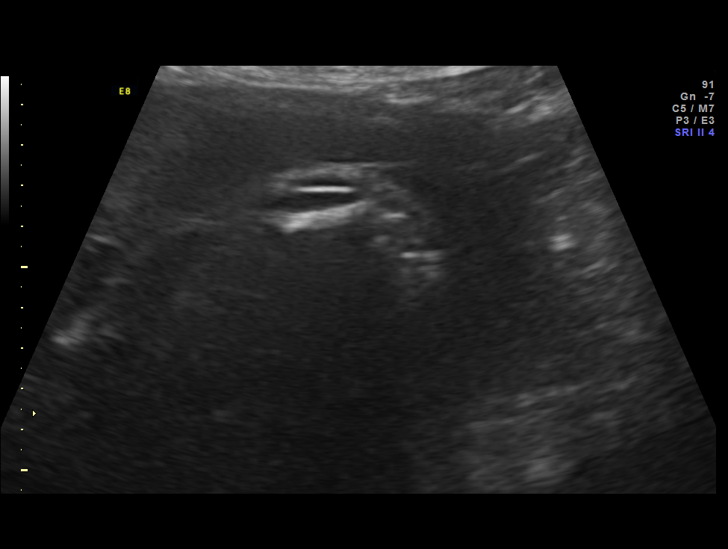
[im 40/49]
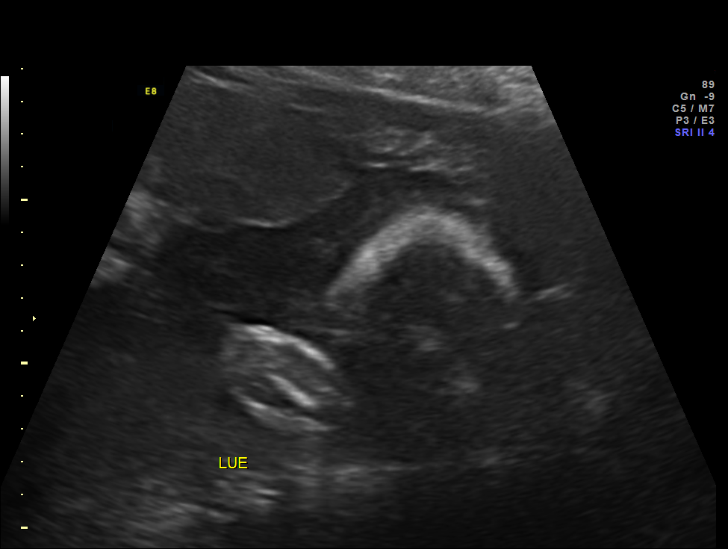
[im 43/49]
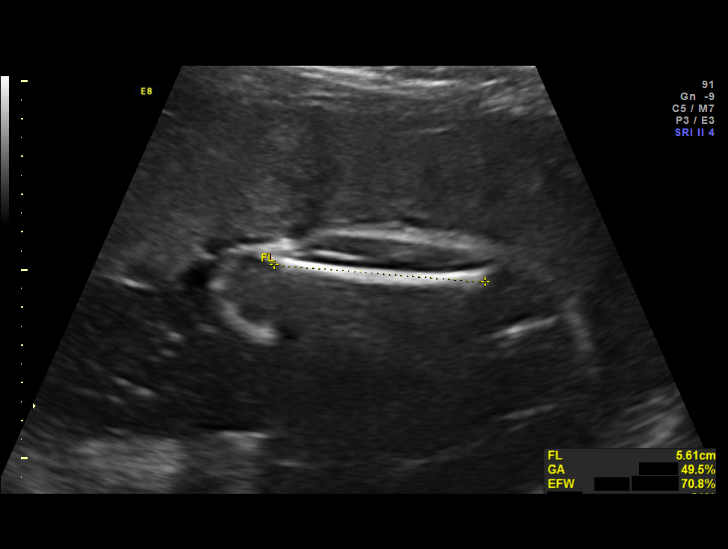
[im 47/49]
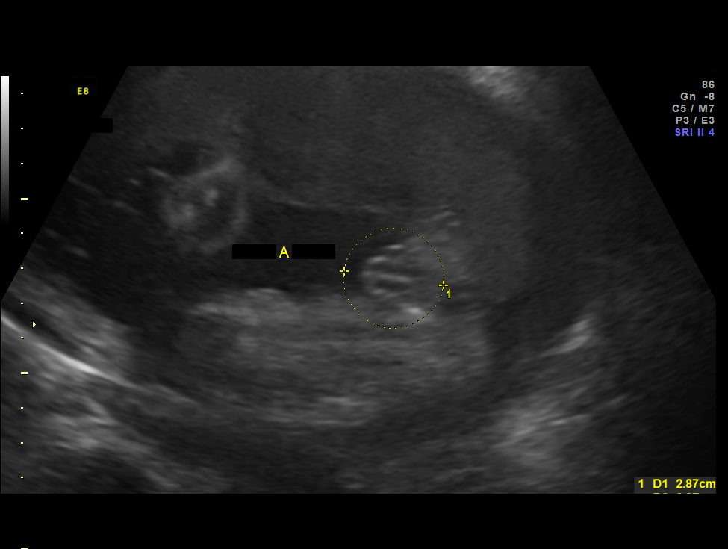

[12 of 28 positions shown; findings below may reference images not displayed]

OBSTETRICS REPORT
                      (Signed Final 02/13/2014 [DATE])

Service(s) Provided

 US OB DETAIL + 14 WK                                  76811.0
Indications

 Detailed fetal anatomic survey
 Advanced maternal age (AMA), Multigravida - low
 risk NIPS
 Maternal morbid obesity (375 lb)
Fetal Evaluation

 Num Of Fetuses:    1
 Fetal Heart Rate:  138                          bpm
 Cardiac Activity:  Observed
 Presentation:      Cephalic
 Placenta:          Anterior Fundal, above
                    cervical os
 P. Cord            Not well visualized
 Insertion:

 Amniotic Fluid
 AFI FV:      Subjectively within normal limits
 AFI Sum:     16.62   cm       61  %Tile     Larg Pckt:    5.71  cm
 RUQ:   5.71    cm   RLQ:    1.99   cm    LUQ:   4.69    cm   LLQ:    4.23   cm
Biometry

 BPD:     73.5  mm     G. Age:  29w 4d                CI:         77.9   70 - 86
 OFD:     94.3  mm                                    FL/HC:      20.8   19.6 -

 HC:     269.4  mm     G. Age:  29w 2d       28  %    HC/AC:      0.99   0.99 -

 AC:     271.3  mm     G. Age:  31w 2d       94  %    FL/BPD:     76.2   71 - 87
 FL:        56  mm     G. Age:  29w 4d       49  %    FL/AC:      20.6   20 - 24
 HUM:     48.5  mm     G. Age:  28w 4d       33  %
 CER:     30.9  mm     G. Age:  27w 0d       19  %

 Est. FW:    5883  gm      3 lb 7 oz     76  %
Gestational Age

 LMP:           29w 0d        Date:  07/25/13                 EDD:   05/01/14
 U/S Today:     30w 0d                                        EDD:   04/24/14
 Best:          29w 0d     Det. By:  LMP  (07/25/13)          EDD:   05/01/14
Anatomy

 Cranium:          Appears normal         Aortic Arch:      Not well visualized
 Fetal Cavum:      Appears normal         Ductal Arch:      Not well visualized
 Ventricles:       Appears normal         Diaphragm:        Not well visualized
 Choroid Plexus:   Appears normal         Stomach:          Appears normal, left
                                                            sided
 Cerebellum:       Appears normal         Abdomen:          Appears normal
 Posterior Fossa:  Appears normal         Abdominal Wall:   Not well visualized
 Nuchal Fold:      Not applicable (>20    Cord Vessels:     Appears normal (3
                   wks GA)                                  vessel cord)
 Face:             Profile nl; orbits not Kidneys:          Appear normal
                   well visualized
 Lips:             Appears normal         Bladder:          Appears normal
 Heart:            Not well visualized    Spine:            Appears normal
 RVOT:             Not well visualized    Lower             Visualized
                                          Extremities:
 LVOT:             Not well visualized    Upper             Visualized
                                          Extremities:

 Other:  Fetus appears to be a female. Heels visualized. Nasal bone
         visualized. Technically difficult due to maternal habitus and fetal
         position.
Targeted Anatomy

 Fetal Central Nervous System
 Cisterna Magna:
Cervix Uterus Adnexa

 Cervix:       Not visualized (advanced GA >56wks)

 Adnexa:     No abnormality visualized.
Impression

 Single IUP at 29w 0d
 Advanced maternal age, Obesity
 NIPS - low risk for fetal aneuploidy
 Normal fetal anatomic survey
 Limited views of the fetal heart were obtained due to maternal
 body habitus
 Overall fetal growth is appropriate (76th %tile).  Of note, the
 AC measures at the 94th %tile.
 Normal amniotic fluid volume
Recommendations

 Recommend follow-up ultrasound examination in 4-6 weeks
 for interval growth due to large AC for gestational age -
 please contact our office if you would prefer that this study be
 performed wth MFM.

## 2015-11-18 ENCOUNTER — Ambulatory Visit (INDEPENDENT_AMBULATORY_CARE_PROVIDER_SITE_OTHER): Payer: Commercial Managed Care - HMO | Admitting: Family Medicine

## 2015-11-18 VITALS — BP 110/84 | HR 82 | Temp 99.1°F | Resp 16 | Ht 65.0 in | Wt 367.0 lb

## 2015-11-18 DIAGNOSIS — K649 Unspecified hemorrhoids: Secondary | ICD-10-CM

## 2015-11-18 DIAGNOSIS — R5383 Other fatigue: Secondary | ICD-10-CM | POA: Diagnosis not present

## 2015-11-18 DIAGNOSIS — R350 Frequency of micturition: Secondary | ICD-10-CM | POA: Diagnosis not present

## 2015-11-18 LAB — POCT URINALYSIS DIP (MANUAL ENTRY)
Bilirubin, UA: NEGATIVE
GLUCOSE UA: NEGATIVE
Ketones, POC UA: NEGATIVE
Leukocytes, UA: NEGATIVE
NITRITE UA: NEGATIVE
Protein Ur, POC: 30 — AB
RBC UA: NEGATIVE
SPEC GRAV UA: 1.015
Urobilinogen, UA: 0.2
pH, UA: 8.5

## 2015-11-18 LAB — POC MICROSCOPIC URINALYSIS (UMFC): Mucus: ABSENT

## 2015-11-18 LAB — TSH: TSH: 2.19 mIU/L

## 2015-11-18 LAB — COMPLETE METABOLIC PANEL WITH GFR
ALBUMIN: 4.3 g/dL (ref 3.6–5.1)
ALK PHOS: 46 U/L (ref 33–115)
ALT: 30 U/L — ABNORMAL HIGH (ref 6–29)
AST: 25 U/L (ref 10–30)
BILIRUBIN TOTAL: 0.6 mg/dL (ref 0.2–1.2)
BUN: 7 mg/dL (ref 7–25)
CALCIUM: 9 mg/dL (ref 8.6–10.2)
CO2: 25 mmol/L (ref 20–31)
CREATININE: 0.77 mg/dL (ref 0.50–1.10)
Chloride: 105 mmol/L (ref 98–110)
GFR, Est African American: >89 mL/min (ref >=60)
GFR, Est Non African American: >89 mL/min (ref >=60)
Glucose, Bld: 92 mg/dL (ref 65–99)
Potassium: 4.2 mmol/L (ref 3.5–5.3)
Sodium: 138 mmol/L (ref 135–146)
Total Protein: 7.9 g/dL (ref 6.1–8.1)

## 2015-11-18 LAB — CBC
HEMATOCRIT: 42 % (ref 35.0–45.0)
Hemoglobin: 14.1 g/dL (ref 11.7–15.5)
MCH: 30.4 pg (ref 27.0–33.0)
MCHC: 33.6 g/dL (ref 32.0–36.0)
MCV: 90.5 fL (ref 80.0–100.0)
MPV: 10 fL (ref 7.5–12.5)
Platelets: 298 10*3/uL (ref 140–400)
RBC: 4.64 MIL/uL (ref 3.80–5.10)
RDW: 13.4 % (ref 11.0–15.0)
WBC: 3 10*3/uL — ABNORMAL LOW (ref 3.8–10.8)

## 2015-11-18 LAB — HEMOGLOBIN A1C
HEMOGLOBIN A1C: 6.6 % — AB (ref <5.7)
Mean Plasma Glucose: 143 mg/dL

## 2015-11-18 MED ORDER — HYDROCORTISONE ACETATE 25 MG RE SUPP: 25.0000 mg | Freq: Two times a day (BID) | RECTAL | Status: DC

## 2015-11-18 NOTE — Progress Notes (Signed)
Subjective:    Patient ID: Kayla [name], female    DOB: 27-Aug-1980, 35 y.o.   MRN: [id number]  HPI This is a 35 yo female who presents with several days of cloudy urine, some increased urinary frequency for about 1 week. No dysuria. Drinks about 7 glasses of water a day. Some vaginal itching and burning for about 1 week. Changed soaps recently. Doesn't think she has had any excessive discharge. Wears panty liner daily.   Has noticed some rectal bleeding. Has had blood in stool, tenderness with bowel movements. Stools loose. Has had long history of hemorrhoid.   Currently sexually active. No birth control. Married. She has a 37 yo and 1.37 yo. No regular exercise. Eats lost of fast food. Rare soda, some sweet tea daily. Energy level and time are barriers to healthy choices.   Has been feeling really fatigued for two weeks. Thought it might be allergies. Takes Zyrtec daily.   Past Medical History  Diagnosis Date  . Morbidly obese (HCC)   . History of hemorrhoids   . Varicose vulva in pregnancy   . Abnormal Pap smear   . History of chlamydia   . Headache(784.0)   . History of hemorrhoids    Past Surgical History  Procedure Laterality Date  . Cholecystectomy  2007  . Ovarian cyst drainage  2003  . Cesarean section N/A 04/27/2014    Procedure: CESAREAN SECTION;  Surgeon: Meriel Picaichard M Holland, MD;  Location: [facility];  Service: Obstetrics;  Laterality: N/A;   Family History  Problem Relation Age of Onset  . Cardiomyopathy Father   . Heart failure Father   . Diabetes Father   . Hypertension Father   . Heart attack Father   . Hypertension Paternal Uncle   . Cancer Maternal Grandmother     breast and stomach  . Anesthesia problems Neg Hx   . Hypotension Neg Hx   . Malignant hyperthermia Neg Hx   . Pseudochol deficiency Neg Hx    Social History  Substance Use Topics  . Smoking status: Never Smoker   . Smokeless tobacco: Never Used  . Alcohol Use: No      Review of  Systems No fever or chills, no back pain    Objective:   Physical Exam  Constitutional: She is oriented to person, place, and time. She appears well-developed and well-nourished. No distress.  Morbidly obese.   HENT:  Head: Atraumatic.  Eyes: Conjunctivae are normal.  Cardiovascular: Normal rate.   Pulmonary/Chest: Effort normal.  Genitourinary: Rectal exam shows external hemorrhoid (soft). Pelvic exam was performed with patient supine. There is no rash, tenderness, lesion or injury on the right labia. There is no rash, tenderness, lesion or injury on the left labia.  Neurological: She is alert and oriented to person, place, and time.  Skin: Skin is warm and dry. She is not diaphoretic.  Psychiatric: She has a normal mood and affect. Her behavior is normal. Judgment and thought content normal.  Vitals reviewed.     BP 110/84 mmHg  Pulse 82  Temp(Src) 99.1 F (37.3 C)  Resp 16  Ht 5\' 5"  (1.651 m)  Wt 367 lb (166.47 kg)  BMI 61.07 kg/m2  SpO2 98%  LMP 10/31/2015 Wt Readings from Last 3 Encounters:  11/18/15 367 lb (166.47 kg)  04/27/14 375 lb (170.099 kg)  02/13/14 375 lb (170.099 kg)   Results for orders placed or performed in visit on 11/18/15  POCT Microscopic Urinalysis ([facility])  Result Value  Ref Range   WBC,UR,HPF,POC Few (A) None WBC/hpf   RBC,UR,HPF,POC None None RBC/hpf   Bacteria Few (A) None, Too numerous to count   Mucus Absent Absent   Epithelial Cells, UR Per Microscopy Few (A) None, Too numerous to count cells/hpf  POCT urinalysis dipstick  Result Value Ref Range   Color, UA yellow yellow   Clarity, UA clear clear   Glucose, UA negative negative   Bilirubin, UA negative negative   Ketones, POC UA negative negative   Spec Grav, UA 1.015    Blood, UA negative negative   pH, UA 8.5    Protein Ur, POC =30 (A) negative   Urobilinogen, UA 0.2    Nitrite, UA Negative Negative   Leukocytes, UA Negative Negative       Assessment & Plan:  1. Urinary  frequency - POCT Microscopic Urinalysis ([facility]) - POCT urinalysis dipstick - hCG, quantitative, pregnancy - urine ok, encouraged her to increase water  2. Morbid obesity, unspecified obesity type (HCC) - TSH - provided information regarding weight loss resources and introduced idea of bariatric surgery  3. Hemorrhoids, unspecified hemorrhoid type - hydrocortisone (ANUSOL-HC) 25 MG suppository; Place 1 suppository (25 mg total) rectally 2 (two) times daily.  Dispense: 12 suppository; Refill: 0 - Ambulatory referral to Gastroenterology  4. Other fatigue - CBC - TSH - COMPLETE METABOLIC PANEL WITH GFR - Hemoglobin A1c - hCG, quantitative, pregnancy - encouraged her to take daily prenatal vitamin if not using birth control  - follow up based on labs Olean Ree, FNP-BC  [facility]  11/18/2015 12:36 PM

## 2015-11-18 NOTE — Patient Instructions (Addendum)
Increase your water intake so your urine is light yellow  Avoid eating out more than 1x per week, meal planning helps with success   Weight loss resources  [facility]- [phone] [place] (weight loss surgery)- [phone] [facility] loss- [phone] Weight Watchers- online and in person meetings- www.weightwatchers.com [facility]- [phone] [facility]- [phone] [facility]- [phone]     Why follow it? Research shows. . Those who follow the Mediterranean diet have a reduced risk of heart disease  . The diet is associated with a reduced incidence of Parkinson's and Alzheimer's diseases . People following the diet may have longer life expectancies and lower rates of chronic diseases  . The Dietary Guidelines for Americans recommends the Mediterranean diet as an eating plan to promote health and prevent disease  What Is the Mediterranean Diet?  . Healthy eating plan based on typical foods and recipes of Mediterranean-style cooking . The diet is primarily a plant based diet; these foods should make up a majority of meals   Starches - Plant based foods should make up a majority of meals - They are an important sources of vitamins, minerals, energy, antioxidants, and fiber - Choose whole grains, foods high in fiber and minimally processed items  - Typical grain sources include wheat, oats, barley, corn, brown rice, bulgar, farro, millet, polenta, couscous  - Various types of beans include chickpeas, lentils, fava beans, black beans, white beans   Fruits  Veggies - Large quantities of antioxidant rich fruits & veggies; 6 or more servings  - Vegetables can be eaten raw or lightly drizzled with oil and cooked  - Vegetables common to the traditional Mediterranean Diet include: artichokes, arugula, beets, broccoli, brussel sprouts, cabbage, carrots, celery, collard greens, cucumbers, eggplant, kale, leeks, lemons,  lettuce, mushrooms, okra, onions, peas, peppers, potatoes, pumpkin, radishes, rutabaga, shallots, spinach, sweet potatoes, turnips, zucchini - Fruits common to the Mediterranean Diet include: apples, apricots, avocados, cherries, clementines, dates, figs, grapefruits, grapes, melons, nectarines, oranges, peaches, pears, pomegranates, strawberries, tangerines  Fats - Replace butter and margarine with healthy oils, such as olive oil, canola oil, and tahini  - Limit nuts to no more than a handful a day  - Nuts include walnuts, almonds, pecans, pistachios, pine nuts  - Limit or avoid candied, honey roasted or heavily salted nuts - Olives are central to the [employer] - can be eaten whole or used in a variety of dishes   Meats Protein - Limiting red meat: no more than a few times a month - When eating red meat: choose lean cuts and keep the portion to the size of deck of cards - Eggs: approx. 0 to 4 times a week  - Fish and lean poultry: at least 2 a week  - Healthy protein sources include, chicken, [place], lean beef, lamb - Increase intake of seafood such as tuna, salmon, trout, mackerel, shrimp, scallops - Avoid or limit high fat processed meats such as sausage and bacon  Dairy - Include moderate amounts of low fat dairy products  - Focus on healthy dairy such as fat free yogurt, skim milk, low or reduced fat cheese - Limit dairy products higher in fat such as whole or 2% milk, cheese, ice cream  Alcohol - Moderate amounts of red wine is ok  - No more than 5 oz daily for women (all ages) and men older than age 41  - No more than 10 oz of wine daily for men  younger than 2665  Other - Limit sweets and other desserts  - Use herbs and spices instead of salt to flavor foods  - Herbs and spices common to the traditional Mediterranean Diet include: basil, bay leaves, chives, cloves, cumin, fennel, garlic, lavender, marjoram, mint, oregano, parsley, pepper, rosemary, sage, savory, sumac, tarragon,  thyme   It's not just a diet, it's a lifestyle:  . The Mediterranean diet includes lifestyle factors typical of those in the region  . Foods, drinks and meals are best eaten with others and savored . Daily physical activity is important for overall good health . This could be strenuous exercise like running and aerobics . This could also be more leisurely activities such as walking, housework, yard-work, or taking the stairs . Moderation is the key; a balanced and healthy diet accommodates most foods and drinks . Consider portion sizes and frequency of consumption of certain foods   Meal Ideas & Options:  . Breakfast:  o Whole wheat toast or whole wheat English muffins with peanut butter & hard boiled egg o Steel cut oats topped with apples & cinnamon and skim milk  o Fresh fruit: banana, strawberries, melon, berries, peaches  o Smoothies: strawberries, bananas, greek yogurt, peanut butter o Low fat greek yogurt with blueberries and granola  o Egg white omelet with spinach and mushrooms o Breakfast couscous: whole wheat couscous, apricots, skim milk, cranberries  . Sandwiches:  o Hummus and grilled vegetables (peppers, zucchini, squash) on whole wheat bread   o Grilled chicken on whole wheat pita with lettuce, tomatoes, cucumbers or tzatziki  o Tuna salad on whole wheat bread: tuna salad made with greek yogurt, olives, red peppers, capers, green onions o Garlic rosemary lamb pita: lamb sauted with garlic, rosemary, salt & pepper; add lettuce, cucumber, greek yogurt to pita - flavor with lemon juice and black pepper  . Seafood:  o Mediterranean grilled salmon, seasoned with garlic, basil, parsley, lemon juice and black pepper o Shrimp, lemon, and spinach whole-grain pasta salad made with low fat greek yogurt  o Seared scallops with lemon orzo  o Seared tuna steaks seasoned salt, pepper, coriander topped with tomato mixture of olives, tomatoes, olive oil, minced garlic, parsley, green  onions and cappers  . Meats:  o Herbed greek chicken salad with kalamata olives, cucumber, feta  o Red bell peppers stuffed with spinach, bulgur, lean ground beef (or lentils) & topped with feta   o Kebabs: skewers of chicken, tomatoes, onions, zucchini, squash  o [place]key burgers: made with red onions, mint, dill, lemon juice, feta cheese topped with roasted red peppers . Vegetarian o Cucumber salad: cucumbers, artichoke hearts, celery, red onion, feta cheese, tossed in olive oil & lemon juice  o Hummus and whole grain pita points with a greek salad (lettuce, tomato, feta, olives, cucumbers, red onion) o Lentil soup with celery, carrots made with vegetable broth, garlic, salt and pepper  o Tabouli salad: parsley, bulgur, mint, scallions, cucumbers, tomato, radishes, lemon juice, olive oil, salt and pepper.         IF you received an x-ray today, you will receive an invoice from [facility]. Please contact [facility] at [phone] with questions or concerns regarding your invoice.   IF you received labwork today, you will receive an invoice from [facility]. Please contact [facility] at [phone] with questions or concerns regarding your invoice.   Our billing staff will not be able to assist you with questions regarding bills from these companies.  You will be contacted with the lab results as soon as they are available. The fastest way to get your results is to activate your My Chart account. Instructions are located on the last page of this paperwork. If you have not heard from [place] regarding the results in 2 weeks, please contact this office.

## 2015-11-19 LAB — HCG, QUANTITATIVE, PREGNANCY: hCG, Beta Chain, Quant, S: <2 m[IU]/mL

## 2015-12-06 ENCOUNTER — Encounter: Payer: Self-pay | Admitting: Gastroenterology

## 2016-02-01 ENCOUNTER — Ambulatory Visit (INDEPENDENT_AMBULATORY_CARE_PROVIDER_SITE_OTHER): Payer: Commercial Managed Care - HMO | Admitting: Gastroenterology

## 2016-02-01 ENCOUNTER — Encounter: Payer: Self-pay | Admitting: Gastroenterology

## 2016-02-01 VITALS — BP 116/80 | HR 80 | Ht 63.25 in | Wt 359.4 lb

## 2016-02-01 DIAGNOSIS — K649 Unspecified hemorrhoids: Secondary | ICD-10-CM | POA: Diagnosis not present

## 2016-02-01 MED ORDER — PRAMOXINE-HC 1-1 % EX CREA: TOPICAL_CREAM | Freq: Three times a day (TID) | CUTANEOUS | 1 refills | Status: DC

## 2016-02-01 MED ORDER — NA SULFATE-K SULFATE-MG SULF 17.5-3.13-1.6 GM/177ML PO SOLN: 1.0000 | Freq: Once | ORAL | 0 refills | Status: AC

## 2016-02-01 NOTE — Progress Notes (Signed)
HPI: This is a  very pleasant 35 year old Kayla [name]  who was referred to me by Emi BelfastGessner, Deborah B, FNP  to evaluate  anal discomfort, rectal bleeding .    Chief complaint is anal discomfort, rectal bleeding  She had 5-6 weeks with red bleeding with BMs.  She usually has 2 BMs per day.  That has not changed during this time.  Was dripping blood.  No trauma to her bottom.  She was seen at urgent clinic and found to have a small external hemorrhoid on examination. Since then she has tried steroid suppositories with minor improvement.  No abd pains.  No colon cancer in her family.  No blood at all in past 2 weeks.  She had a flare of hemorrhoids during pregnancy.  Rare constipation.   Never had colonoscopy.  Review of systems: Pertinent positive and negative review of systems were noted in the above HPI section. Complete review of systems was performed and was otherwise normal.   Past Medical History:  Diagnosis Date  . Abnormal Pap smear   . Headache(784.0)   . History of chlamydia   . History of hemorrhoids   . Morbidly obese (HCC)   . Pre-diabetes   . Varicose vulva in pregnancy     Past Surgical History:  Procedure Laterality Date  . CESAREAN SECTION N/A 04/27/2014   Procedure: CESAREAN SECTION;  Surgeon: Meriel Picaichard M Holland, MD;  Location: [facility];  Service: Obstetrics;  Laterality: N/A;  . CHOLECYSTECTOMY  2007  . OVARIAN CYST DRAINAGE  2003    Current Outpatient Prescriptions  Medication Sig Dispense Refill  . Cholecalciferol (VITAMIN D) 2000 units CAPS Take 1 capsule by mouth daily.     No current facility-administered medications for this visit.     Allergies as of 02/01/2016 - Review Complete 02/01/2016  Allergen Reaction Noted  . Bactrim Nausea And Vomiting and Other (See Comments) 01/18/2011    Family History  Problem Relation Age of Onset  . Cardiomyopathy Father   . Heart failure Father   . Diabetes Father   . Hypertension Father   . Heart attack  Father   . Hypertension Paternal Uncle   . Cancer Paternal Uncle      type unknown  . Breast cancer Maternal Grandmother   . Anesthesia problems Neg Hx   . Hypotension Neg Hx   . Malignant hyperthermia Neg Hx   . Pseudochol deficiency Neg Hx     Social History   Social History  . Marital status: Married    Spouse name: N/A  . Number of children: 2  . Years of education: N/A   Occupational History  . human resources    Social History Main Topics  . Smoking status: Never Smoker  . Smokeless tobacco: Never Used  . Alcohol use No  . Drug use: No  . Sexual activity: Yes    Birth control/ protection: None   Other Topics Concern  . Not on file   Social History Narrative  . No narrative on file     Physical Exam: BP 116/80 (BP Location: Left Wrist, Patient Position: Sitting, Cuff Size: Normal)   Pulse 80   Ht 5' 3.25" (1.607 m) Comment: height measured without shoes  Wt (!) 359 lb 6 oz (163 kg)   LMP 02/01/2016   BMI 63.16 kg/m  Constitutional: generally well-appearing Psychiatric: alert and oriented x3 Eyes: extraocular movements intact Mouth: oral pharynx moist, no lesions Neck: supple no lymphadenopathy Cardiovascular: heart regular rate and rhythm  Lungs: clear to auscultation bilaterally Abdomen: soft, nontender, nondistended, no obvious ascites, no peritoneal signs, normal bowel sounds Extremities: no lower extremity edema bilaterally Skin: no lesions on visible extremities Rectal exam deferred for upcoming colonoscopy  Assessment and plan: 35 y.o. female with symptomatic hemorrhoid  The bleeding is stopped but she is still bothered by anal discomfort. We will call in a prescription for analpram and I recommended that we proceed with full colonoscopy at her soonest convenience to exclude other potential causes to think are unlikely. She has a BMI of 63 and therefore cannot be done at our outpatient center. We will schedule the colonoscopy at [facility].  6 Rob Bunting, MD [facility] 02/01/2016, 2:57 PM  Cc: Emi Belfast, FNP

## 2016-02-01 NOTE — Patient Instructions (Signed)
You will be set up for a colonoscopy (at [facility] with MAC sedation). Analpram ointment, use twice daily for now.

## 2016-02-07 ENCOUNTER — Encounter (HOSPITAL_COMMUNITY): Payer: Self-pay | Admitting: *Deleted

## 2016-02-10 ENCOUNTER — Encounter (HOSPITAL_COMMUNITY): Payer: Self-pay

## 2016-02-10 ENCOUNTER — Ambulatory Visit (HOSPITAL_COMMUNITY): Payer: Commercial Managed Care - HMO | Admitting: Anesthesiology

## 2016-02-10 ENCOUNTER — Encounter (HOSPITAL_COMMUNITY)
Admission: RE | Disposition: A | Discharge status: Home / Self Care | Payer: Self-pay | Source: Ambulatory Visit | Attending: Gastroenterology

## 2016-02-10 ENCOUNTER — Ambulatory Visit (HOSPITAL_COMMUNITY)
Admission: RE | Admit: 2016-02-10 | Discharge: 2016-02-10 | Disposition: A | Discharge status: Home / Self Care | Payer: Commercial Managed Care - HMO | Source: Ambulatory Visit | Attending: Gastroenterology | Admitting: Gastroenterology

## 2016-02-10 ENCOUNTER — Encounter: Payer: Self-pay | Admitting: Gastroenterology

## 2016-02-10 ENCOUNTER — Telehealth: Payer: Self-pay

## 2016-02-10 DIAGNOSIS — K6289 Other specified diseases of anus and rectum: Secondary | ICD-10-CM

## 2016-02-10 DIAGNOSIS — K625 Hemorrhage of anus and rectum: Secondary | ICD-10-CM

## 2016-02-10 DIAGNOSIS — K649 Unspecified hemorrhoids: Secondary | ICD-10-CM

## 2016-02-10 DIAGNOSIS — K644 Residual hemorrhoidal skin tags: Secondary | ICD-10-CM

## 2016-02-10 DIAGNOSIS — Z6841 Body Mass Index (BMI) 40.0 and over, adult: Secondary | ICD-10-CM | POA: Insufficient documentation

## 2016-02-10 HISTORY — PX: COLONOSCOPY WITH PROPOFOL: SHX5780

## 2016-02-10 SURGERY — COLONOSCOPY WITH PROPOFOL
Anesthesia: Monitor Anesthesia Care

## 2016-02-10 MED ORDER — SODIUM CHLORIDE 0.9 % IV SOLN: INTRAVENOUS | Status: DC

## 2016-02-10 MED ORDER — LACTATED RINGERS IV SOLN
INTRAVENOUS | Status: DC
  Administered 2016-02-10: 1000 mL via INTRAVENOUS

## 2016-02-10 MED ORDER — PROPOFOL 10 MG/ML IV BOLUS
INTRAVENOUS | Status: AC
  Filled 2016-02-10: qty 40

## 2016-02-10 MED ORDER — PROPOFOL 10 MG/ML IV BOLUS
INTRAVENOUS | Status: DC | PRN
  Administered 2016-02-10 (×4): 20 mg via INTRAVENOUS
  Administered 2016-02-10: 30 mg via INTRAVENOUS
  Administered 2016-02-10 (×6): 20 mg via INTRAVENOUS
  Administered 2016-02-10: 50 mg via INTRAVENOUS

## 2016-02-10 SURGICAL SUPPLY — 21 items

## 2016-02-10 NOTE — Anesthesia Postprocedure Evaluation (Signed)
Anesthesia Post Note  Patient: Kayla [name]  Procedure(s) Performed: Procedure(s) (LRB): COLONOSCOPY WITH PROPOFOL (N/A)  Patient location during evaluation: PACU Anesthesia Type: MAC Level of consciousness: awake and alert Pain management: pain level controlled Vital Signs Assessment: post-procedure vital signs reviewed and stable Respiratory status: spontaneous breathing, nonlabored ventilation, respiratory function stable and patient connected to nasal cannula oxygen Cardiovascular status: stable and blood pressure returned to baseline Anesthetic complications: no    Last Vitals:  Vitals:   02/10/16 0920 02/10/16 0922  BP: 121/68 121/68  Pulse: 64 63  Resp: 17 18  Temp:      Last Pain:  Vitals:   02/10/16 0738  TempSrc: Oral                 Katrice Goel DAVID

## 2016-02-10 NOTE — Telephone Encounter (Signed)
You have been scheduled for an appointment with [facility]. Your appointment is on 02/14/16 at 9 am. Please arrive at 845 am for registration. Make certain to bring a list of current medications, including any over the counter medications or vitamins. Also bring your co-pay if you have one as well as your insurance cards. [facility] is located at [address], [address]. Should you need to reschedule your appointment, please contact them at [phone].  Shanda BumpsJessica with [facility] has called and notified the pt

## 2016-02-10 NOTE — Telephone Encounter (Signed)
-----   Message from Rachael Feeaniel P Jacobs, MD sent at 02/10/2016  9:08 AM EDT ----- She needs referral to [facility] general surgery for large external hemorrhoids, currently not thrombosed.  thanks

## 2016-02-10 NOTE — Op Note (Signed)
[facility] Patient Name: Kayla [name] Procedure Date: 02/10/2016 MRN: [id number] Attending MD: Rachael Fee , MD Date of Birth: 1980/11/09 CSN: [id number] Age: 35 Admit Type: Inpatient Procedure:                Colonoscopy Indications:              Rectal bleeding, Rectal pain Providers:                Rachael Fee, MD, Dwain Sarna, RN, Kandice Robinsons, Technician Referring MD:              Medicines:                Monitored Anesthesia Care Complications:            No immediate complications. Estimated blood loss:                            None. Estimated Blood Loss:     Estimated blood loss: none. Procedure:                Pre-Anesthesia Assessment:                           - Prior to the procedure, a History and Physical                            was performed, and patient medications and                            allergies were reviewed. The patient's tolerance of                            previous anesthesia was also reviewed. The risks                            and benefits of the procedure and the sedation                            options and risks were discussed with the patient.                            All questions were answered, and informed consent                            was obtained. Prior Anticoagulants: The patient has                            taken no previous anticoagulant or antiplatelet                            agents. ASA Grade Assessment: III - A patient with                            severe systemic  disease. After reviewing the risks                            and benefits, the patient was deemed in                            satisfactory condition to undergo the procedure.                           After obtaining informed consent, the colonoscope                            was passed under direct vision. Throughout the                            procedure, the patient's blood  pressure, pulse, and                            oxygen saturations were monitored continuously. The                            EC-[id number] ([id number] scope was introduced through                            the anus and advanced to the the cecum, identified                            by appendiceal orifice and ileocecal valve. The                            colonoscopy was performed without difficulty. The                            patient tolerated the procedure well. The quality                            of the bowel preparation was excellent. The                            ileocecal valve, appendiceal orifice, and rectum                            were photographed. Findings:      Large, non-trhombosed external hemorrhoids were found during perianal       exam. One trunk was 4cm long.      The exam was otherwise without abnormality on direct and retroflexion       views. Impression:               - Large, non-thrombosed external hemorrhoids.                           - The examination was otherwise normal on direct  and retroflexion views.                           - No specimens collected. Moderate Sedation:      N/A- Per Anesthesia Care Recommendation:           - Patient has a contact number available for                            emergencies. The signs and symptoms of potential                            delayed complications were discussed with the                            patient. Return to normal activities tomorrow.                            Written discharge instructions were provided to the                            patient.                           - Resume previous diet.                           - Continue present medications.                           - Dr. Christella Hartigan' office will arrange referral to                            general surgery to consider hemorrhoid resection. Procedure Code(s):        --- Professional ---                            [phone], Colonoscopy, flexible; diagnostic, including                            collection of specimen(s) by brushing or washing,                            when performed (separate procedure) Diagnosis Code(s):        --- Professional ---                           K64.4, Residual hemorrhoidal skin tags                           K62.5, Hemorrhage of anus and rectum                           K62.89, Other specified diseases of anus and rectum CPT copyright 2016 American Medical Association. All rights reserved. The codes documented in this report are preliminary and upon coder review may  be revised to meet current compliance requirements.  Rachael Fee, MD 02/10/2016 9:06:46 AM This report has been signed electronically. Number of Addenda: 0

## 2016-02-10 NOTE — Anesthesia Preprocedure Evaluation (Signed)
Anesthesia Evaluation  Patient identified by MRN, date of birth, ID band Patient awake    Reviewed: Allergy & Precautions, NPO status , Patient's Chart, lab work & pertinent test results  Airway Mallampati: I  TM Distance: >3 FB Neck ROM: Full    Dental   Pulmonary    Pulmonary exam normal        Cardiovascular Normal cardiovascular exam     Neuro/Psych    GI/Hepatic   Endo/Other  Morbid obesity  Renal/GU      Musculoskeletal   Abdominal   Peds  Hematology   Anesthesia Other Findings   Reproductive/Obstetrics                             Anesthesia Physical Anesthesia Plan  ASA: III  Anesthesia Plan: MAC   Post-op Pain Management:    Induction: Intravenous  Airway Management Planned: Mask  Additional Equipment:   Intra-op Plan:   Post-operative Plan:   Informed Consent: I have reviewed the patients History and Physical, chart, labs and discussed the procedure including the risks, benefits and alternatives for the proposed anesthesia with the patient or authorized representative who has indicated his/her understanding and acceptance.     Plan Discussed with: CRNA and Surgeon  Anesthesia Plan Comments:         Anesthesia Quick Evaluation

## 2016-02-10 NOTE — Interval H&P Note (Signed)
History and Physical Interval Note:  02/10/2016 7:41 AM  Kayla [name]  has presented today for surgery, with the diagnosis of hemorrhoids  The various methods of treatment have been discussed with the patient and family. After consideration of risks, benefits and other options for treatment, the patient has consented to  Procedure(s): COLONOSCOPY WITH PROPOFOL (N/A) as a surgical intervention .  The patient's history has been reviewed, patient examined, no change in status, stable for surgery.  I have reviewed the patient's chart and labs.  Questions were answered to the patient's satisfaction.     Rachael FeeJacobs, Daniel P

## 2016-02-10 NOTE — H&P (View-Only) (Signed)
HPI: This is a  very pleasant 35 year old woman  who was referred to me by Emi BelfastGessner, Deborah B, FNP  to evaluate  anal discomfort, rectal bleeding .    Chief complaint is anal discomfort, rectal bleeding  She had 5-6 weeks with red bleeding with BMs.  She usually has 2 BMs per day.  That has not changed during this time.  Was dripping blood.  No trauma to her bottom.  She was seen at urgent clinic and found to have a small external hemorrhoid on examination. Since then she has tried steroid suppositories with minor improvement.  No abd pains.  No colon cancer in her family.  No blood at all in past 2 weeks.  She had a flare of hemorrhoids during pregnancy.  Rare constipation.   Never had colonoscopy.  Review of systems: Pertinent positive and negative review of systems were noted in the above HPI section. Complete review of systems was performed and was otherwise normal.   Past Medical History:  Diagnosis Date  . Abnormal Pap smear   . Headache(784.0)   . History of chlamydia   . History of hemorrhoids   . Morbidly obese (HCC)   . Pre-diabetes   . Varicose vulva in pregnancy     Past Surgical History:  Procedure Laterality Date  . CESAREAN SECTION N/A 04/27/2014   Procedure: CESAREAN SECTION;  Surgeon: Meriel Picaichard M Holland, MD;  Location: [facility];  Service: Obstetrics;  Laterality: N/A;  . CHOLECYSTECTOMY  2007  . OVARIAN CYST DRAINAGE  2003    Current Outpatient Prescriptions  Medication Sig Dispense Refill  . Cholecalciferol (VITAMIN D) 2000 units CAPS Take 1 capsule by mouth daily.     No current facility-administered medications for this visit.     Allergies as of 02/01/2016 - Review Complete 02/01/2016  Allergen Reaction Noted  . Bactrim Nausea And Vomiting and Other (See Comments) 01/18/2011    Family History  Problem Relation Age of Onset  . Cardiomyopathy Father   . Heart failure Father   . Diabetes Father   . Hypertension Father   . Heart attack  Father   . Hypertension Paternal Uncle   . Cancer Paternal Uncle      type unknown  . Breast cancer Maternal Grandmother   . Anesthesia problems Neg Hx   . Hypotension Neg Hx   . Malignant hyperthermia Neg Hx   . Pseudochol deficiency Neg Hx     Social History   Social History  . Marital status: Married    Spouse name: N/A  . Number of children: 2  . Years of education: N/A   Occupational History  . human resources    Social History Main Topics  . Smoking status: Never Smoker  . Smokeless tobacco: Never Used  . Alcohol use No  . Drug use: No  . Sexual activity: Yes    Birth control/ protection: None   Other Topics Concern  . Not on file   Social History Narrative  . No narrative on file     Physical Exam: BP 116/80 (BP Location: Left Wrist, Patient Position: Sitting, Cuff Size: Normal)   Pulse 80   Ht 5' 3.25" (1.607 m) Comment: height measured without shoes  Wt (!) 359 lb 6 oz (163 kg)   LMP 02/01/2016   BMI 63.16 kg/m  Constitutional: generally well-appearing Psychiatric: alert and oriented x3 Eyes: extraocular movements intact Mouth: oral pharynx moist, no lesions Neck: supple no lymphadenopathy Cardiovascular: heart regular rate and rhythm  Lungs: clear to auscultation bilaterally Abdomen: soft, nontender, nondistended, no obvious ascites, no peritoneal signs, normal bowel sounds Extremities: no lower extremity edema bilaterally Skin: no lesions on visible extremities Rectal exam deferred for upcoming colonoscopy  Assessment and plan: 35 y.o. female with symptomatic hemorrhoid  The bleeding is stopped but she is still bothered by anal discomfort. We will call in a prescription for analpram and I recommended that we proceed with full colonoscopy at her soonest convenience to exclude other potential causes to think are unlikely. She has a BMI of 63 and therefore cannot be done at our outpatient center. We will schedule the colonoscopy at [facility].  6 Daniel Jacobs, MD [facility] 02/01/2016, 2:57 PM  Cc: Gessner, Deborah B, FNP  

## 2016-02-10 NOTE — Transfer of Care (Signed)
Immediate Anesthesia Transfer of Care Note  Patient: Kayla [name]  Procedure(s) Performed: Procedure(s): COLONOSCOPY WITH PROPOFOL (N/A)  Patient Location: PACU  Anesthesia Type:MAC  Level of Consciousness: sedated  Airway & Oxygen Therapy: Patient Spontanous Breathing and Patient connected to nasal cannula oxygen  Post-op Assessment: Report given to RN and Post -op Vital signs reviewed and stable  Post vital signs: Reviewed and stable  Last Vitals:  Vitals:   02/10/16 0738  BP: 119/62  Pulse: 74  Resp: 14  Temp: 36.8 C    Last Pain:  Vitals:   02/10/16 0738  TempSrc: Oral         Complications: No apparent anesthesia complications

## 2016-02-10 NOTE — Discharge Instructions (Signed)

## 2016-02-11 ENCOUNTER — Encounter (HOSPITAL_COMMUNITY): Payer: Self-pay | Admitting: Gastroenterology

## 2016-02-14 ENCOUNTER — Ambulatory Visit: Payer: Self-pay | Admitting: Surgery

## 2016-02-25 ENCOUNTER — Ambulatory Visit (INDEPENDENT_AMBULATORY_CARE_PROVIDER_SITE_OTHER): Payer: Commercial Managed Care - HMO | Admitting: Physician Assistant

## 2016-02-25 VITALS — BP 122/70 | HR 70 | Temp 98.2°F | Resp 16 | Ht 63.75 in | Wt 350.2 lb

## 2016-02-25 DIAGNOSIS — A499 Bacterial infection, unspecified: Secondary | ICD-10-CM | POA: Diagnosis not present

## 2016-02-25 DIAGNOSIS — B3731 Acute candidiasis of vulva and vagina: Secondary | ICD-10-CM

## 2016-02-25 DIAGNOSIS — N760 Acute vaginitis: Secondary | ICD-10-CM

## 2016-02-25 DIAGNOSIS — L298 Other pruritus: Secondary | ICD-10-CM | POA: Diagnosis not present

## 2016-02-25 DIAGNOSIS — R110 Nausea: Secondary | ICD-10-CM

## 2016-02-25 DIAGNOSIS — N898 Other specified noninflammatory disorders of vagina: Secondary | ICD-10-CM | POA: Diagnosis not present

## 2016-02-25 DIAGNOSIS — R300 Dysuria: Secondary | ICD-10-CM | POA: Diagnosis not present

## 2016-02-25 DIAGNOSIS — B373 Candidiasis of vulva and vagina: Secondary | ICD-10-CM | POA: Diagnosis not present

## 2016-02-25 DIAGNOSIS — B9689 Other specified bacterial agents as the cause of diseases classified elsewhere: Secondary | ICD-10-CM

## 2016-02-25 LAB — POCT URINALYSIS DIP (MANUAL ENTRY)
BILIRUBIN UA: NEGATIVE
BILIRUBIN UA: NEGATIVE
GLUCOSE UA: NEGATIVE
Nitrite, UA: NEGATIVE
PH UA: 6
Protein Ur, POC: 30 — AB
SPEC GRAV UA: 1.025
Urobilinogen, UA: 0.2

## 2016-02-25 LAB — POCT WET + KOH PREP: TRICH BY WET PREP: ABSENT

## 2016-02-25 LAB — POC MICROSCOPIC URINALYSIS (UMFC): Mucus: ABSENT

## 2016-02-25 LAB — POCT URINE PREGNANCY: PREG TEST UR: NEGATIVE

## 2016-02-25 MED ORDER — METRONIDAZOLE 500 MG PO TABS: 500.0000 mg | ORAL_TABLET | Freq: Two times a day (BID) | ORAL | 0 refills | Status: AC

## 2016-02-25 MED ORDER — FLUCONAZOLE 150 MG PO TABS: 150.0000 mg | ORAL_TABLET | Freq: Once | ORAL | 0 refills | Status: AC

## 2016-02-25 NOTE — Patient Instructions (Addendum)
Please take medication as prescribed.   Take 1 tablet of the diflucan.  Wait until the end of the metronidazole before taking the second tablet of the diflucan. Do not take metronidazole with alcohol.    IF you received an x-ray today, you will receive an invoice from [facility]. Please contact [facility] at [phone] with questions or concerns regarding your invoice.   IF you received labwork today, you will receive an invoice from [facility]. Please contact [facility] at [phone] with questions or concerns regarding your invoice.   Our billing staff will not be able to assist you with questions regarding bills from these companies.  You will be contacted with the lab results as soon as they are available. The fastest way to get your results is to activate your My Chart account. Instructions are located on the last page of this paperwork. If you have not heard from us regarding the results in 2 weeks, please contact this office.

## 2016-02-25 NOTE — Progress Notes (Signed)
[facility] [address], [place] [address] [phone] By signing my name below, I, [name], attest that this documentation has been prepared under the direction and in the presence of Treatment Team:  Attending Provider: Ethelda ChickKristi M Smith, MD Physician Assistant: Garnetta BuddyStephanie D English, PA.  Electronically Signed: Arvilla MarketMesha Guinyard, Medical Scribe. 02/25/16. 12:03 PM.  Date:  02/25/2016   Name:  Kayla [name]   DOB:  Mar 07, 1981   MRN:  [id number]  PCP:  Emi Belfasteborah B Gessner, FNP   Chief Complaint  Patient presents with   Vaginal Itching   History of Present Illness:  Kayla [name] is a 35 y.o. female patient [facility] of vaginal itching onset 3 days. Pt has some vaginal irritation. Pt feels a little pain with urination but suspects it's due to using prednisone for her hemorrhoids. Pt mentions she has a follow-up with another provider for hemorrhoid surgery. Pt reports associated symptoms of urinary frequency, episodic nausea for 2 days, and dark urine this morning after she drank coffee. Pt is sexually active, and has a hx of frequent yeast infections that's triggered by change of soaps. Pt states there is a possibility that she's pregnant; her LMP began Aug 1st and ended Aug 4th. Pt denies vaginal discharge, vaginal odor, and abdominal pain.   Patient Active Problem List   Diagnosis Date Noted   Pregnancy 04/27/2014   Advanced maternal age (AMA) in pregnancy 02/13/2014   Obesity in pregnancy, antepartum 02/13/2014    Past Medical History:  Diagnosis Date   Abnormal Pap smear    Headache(784.0)    History of chlamydia    History of hemorrhoids    Morbidly obese (HCC)    Pre-diabetes    Varicose vulva in pregnancy     Past Surgical History:  Procedure Laterality Date   CESAREAN SECTION N/A 04/27/2014   Procedure: CESAREAN SECTION;  Surgeon: Meriel Picaichard M Holland, MD;  Location: [facility];  Service:  Obstetrics;  Laterality: N/A;   CHOLECYSTECTOMY  2007   COLONOSCOPY WITH PROPOFOL N/A 02/10/2016   Procedure: COLONOSCOPY WITH PROPOFOL;  Surgeon: Rachael Feeaniel P Jacobs, MD;  Location: [facility];  Service: Endoscopy;  Laterality: N/A;   OVARIAN CYST DRAINAGE  2003    Social History  Substance Use Topics   Smoking status: Never Smoker   Smokeless tobacco: Never Used   Alcohol use No    Family History  Problem Relation Age of Onset   Cardiomyopathy Father    Heart failure Father    Diabetes Father    Hypertension Father    Heart attack Father    Hypertension Paternal Uncle    Cancer Paternal Uncle      type unknown   Breast cancer Maternal Grandmother    Anesthesia problems Neg Hx    Hypotension Neg Hx    Malignant hyperthermia Neg Hx    Pseudochol deficiency Neg Hx     Allergies  Allergen Reactions   Bactrim Nausea And Vomiting and Other (See Comments)    Overall makes her feel "horrible"  Body aches n/v, HA    Medication list has been reviewed and updated.  Current Outpatient Prescriptions on File Prior to Visit  Medication Sig Dispense Refill   Cholecalciferol (VITAMIN D) 2000 units CAPS Take 1 capsule by mouth daily.     No current facility-administered medications on file prior to visit.    Review of Systems  Gastrointestinal: Negative for abdominal pain.  Genitourinary: Positive  for dysuria and frequency.       Neg vaginal odor Neg vaginal discharge  ROS otherwise unremarkable unless listed above.  Physical Examination: BP 122/70 (BP Location: Right Arm, Patient Position: Sitting, Cuff Size: Large)    Pulse 70    Temp 98.2 F (36.8 C) (Oral)    Resp 16    Ht 5' 3.75" (1.619 m)    Wt (!) 350 lb 3.2 oz (158.8 kg)    LMP 02/01/2016 (Exact Date) Comment: Pt. states no chance shes pregnant   SpO2 98%    BMI 60.58 kg/m  Ideal Body Weight: @FLOWAMB ([id number])@  Physical Exam  Constitutional: She is oriented to person, place, and time. She  appears well-developed and well-nourished. No distress.  HENT:  Head: Normocephalic and atraumatic.  Right Ear: External ear normal.  Left Ear: External ear normal.  Eyes: Conjunctivae and EOM are normal. Pupils are equal, round, and reactive to light.  Cardiovascular: Normal rate.   Pulmonary/Chest: Effort normal. No respiratory distress.  Neurological: She is alert and oriented to person, place, and time.  Skin: She is not diaphoretic.  Psychiatric: She has a normal mood and affect. Her behavior is normal.   Results for orders placed or performed in visit on 02/25/16  POCT urine pregnancy  Result Value Ref Range   Preg Test, Ur Negative Negative  POCT urinalysis dipstick  Result Value Ref Range   Color, UA yellow yellow   Clarity, UA clear clear   Glucose, UA negative negative   Bilirubin, UA negative negative   Ketones, POC UA negative negative   Spec Grav, UA 1.025    Blood, UA trace-intact (A) negative   pH, UA 6.0    Protein Ur, POC =30 (A) negative   Urobilinogen, UA 0.2    Nitrite, UA Negative Negative   Leukocytes, UA small (1+) (A) Negative  POCT Microscopic Urinalysis ([facility])  Result Value Ref Range   WBC,UR,HPF,POC Moderate (A) None WBC/hpf   RBC,UR,HPF,POC None None RBC/hpf   Bacteria Few (A) None, Too numerous to count   Mucus Absent Absent   Epithelial Cells, UR Per Microscopy Few (A) None, Too numerous to count cells/hpf  POCT Wet + KOH Prep  Result Value Ref Range   Yeast by KOH Present Present, Absent   Yeast by wet prep Present Present, Absent   WBC by wet prep Moderate (A) None, Few, Too numerous to count   Clue Cells Wet Prep HPF POC Few (A) None, Too numerous to count   Trich by wet prep Absent Present, Absent   Bacteria Wet Prep HPF POC Moderate (A) None, Few, Too numerous to count   Epithelial Cells By [employer] ([facility]) None None, Few, Too numerous to count   RBC,UR,HPF,POC Few (A) None RBC/hpf   Assessment and Plan: Kayla [name] is a 35  y.o. female who is here today for vaginal pruritus. bv and diflucan.  Advised use of diflucan.  Can follow up with other diflucan after full use of metronidazole, if symptoms continue.  rtc as needed.  Dysuria - Plan: POCT urinalysis dipstick  Vaginal pruritus - Plan: POCT urine pregnancy, POCT urinalysis dipstick, POCT Microscopic Urinalysis ([facility]), POCT Wet + KOH Prep  Vaginal irritation - Plan: POCT urine pregnancy, POCT urinalysis dipstick, POCT Microscopic Urinalysis ([facility]), POCT Wet + KOH Prep  Nausea without vomiting - Plan: POCT urine pregnancy  BV (bacterial vaginosis) - Plan: metroNIDAZOLE (FLAGYL) 500 MG tablet  Vaginal yeast infection - Plan: fluconazole (DIFLUCAN) 150 MG  tablet   Trena Platt, PA-C [facility] 02/25/2016 12:03 PM I personally performed the services described in this documentation, which was scribed in my presence. The recorded information has been reviewed and is accurate.

## 2016-03-06 ENCOUNTER — Encounter: Payer: Self-pay | Admitting: Physician Assistant

## 2016-03-10 ENCOUNTER — Telehealth: Payer: Self-pay | Admitting: Gastroenterology

## 2016-03-13 ENCOUNTER — Other Ambulatory Visit: Payer: Self-pay | Admitting: Physician Assistant

## 2016-03-13 MED ORDER — METRONIDAZOLE 500 MG PO TABS: 500.0000 mg | ORAL_TABLET | Freq: Two times a day (BID) | ORAL | 0 refills | Status: DC

## 2016-03-13 NOTE — Telephone Encounter (Signed)
Pt does not agree with the decision that Dr Christella HartiganJacobs put on her FMLA paper work, it says the pt should not miss work due to hemorrhoid flare ups. She wants you to reconsider.  Please advise.

## 2016-03-13 NOTE — Telephone Encounter (Signed)
Left message on machine to call back  

## 2016-12-18 ENCOUNTER — Encounter: Payer: Self-pay | Admitting: Emergency Medicine

## 2016-12-18 ENCOUNTER — Ambulatory Visit (INDEPENDENT_AMBULATORY_CARE_PROVIDER_SITE_OTHER): Payer: 59 | Admitting: Emergency Medicine

## 2016-12-18 VITALS — BP 130/82 | HR 82 | Temp 98.8°F | Resp 18 | Ht 63.75 in | Wt 367.4 lb

## 2016-12-18 DIAGNOSIS — L03012 Cellulitis of left finger: Secondary | ICD-10-CM | POA: Diagnosis not present

## 2016-12-18 DIAGNOSIS — H1032 Unspecified acute conjunctivitis, left eye: Secondary | ICD-10-CM | POA: Insufficient documentation

## 2016-12-18 MED ORDER — MUPIROCIN 2 % EX OINT: 1.0000 "application " | TOPICAL_OINTMENT | Freq: Two times a day (BID) | CUTANEOUS | 1 refills | Status: DC

## 2016-12-18 MED ORDER — TOBRAMYCIN 0.3 % OP SOLN: 2.0000 [drp] | OPHTHALMIC | 1 refills | Status: DC

## 2016-12-18 MED ORDER — CEFADROXIL 500 MG PO CAPS: 500.0000 mg | ORAL_CAPSULE | Freq: Two times a day (BID) | ORAL | 0 refills | Status: AC

## 2016-12-18 NOTE — Progress Notes (Signed)
Kayla [name] 36 y.o.   Chief Complaint  Patient presents with  . Rash    on left ring finger from a ring x703months but just started flaring up x2weeks   . Eye Pain    left eye pain woke up this way     HISTORY OF PRESENT ILLNESS: This is a 36 y.o. female complaining of rash to left 4th finger and red left eye.  HPI   Prior to Admission medications   Medication Sig Start Date End Date Taking? Authorizing Provider  cefadroxil (DURICEF) 500 MG capsule Take 1 capsule (500 mg total) by mouth 2 (two) times daily. 12/18/16 12/25/16  Georgina QuintSagardia, Ector Laurel Jose, MD  Cholecalciferol (VITAMIN D) 2000 units CAPS Take 1 capsule by mouth daily.    [provider]  metroNIDAZOLE (FLAGYL) 500 MG tablet Take 1 tablet (500 mg total) by mouth 2 (two) times daily. Patient not taking: Reported on 12/18/2016 03/13/16   Trena PlattEnglish, Stephanie D, PA  mupirocin ointment (BACTROBAN) 2 % Apply 1 application topically 2 (two) times daily. 12/18/16   Georgina QuintSagardia, Jacaria Colburn Jose, MD  pramoxine-hydrocortisone [facility](ANALPRAM HC) cream Apply topically 3 (three) times daily.    [provider]  tobramycin (TOBREX) 0.3 % ophthalmic solution Place 2 drops into the left eye every 4 (four) hours. 12/18/16   Georgina QuintSagardia, Daiel Strohecker Jose, MD    Allergies  Allergen Reactions  . Bactrim Nausea And Vomiting and Other (See Comments)    Overall makes her feel "horrible"  Body aches n/v, HA    Patient Active Problem List   Diagnosis Date Noted  . Cellulitis of finger of left hand 12/18/2016  . Acute bacterial conjunctivitis of left eye 12/18/2016  . Pregnancy 04/27/2014  . Advanced maternal age (AMA) in pregnancy 02/13/2014  . Obesity in pregnancy, antepartum 02/13/2014    Past Medical History:  Diagnosis Date  . Abnormal Pap smear   . Headache(784.0)   . History of chlamydia   . History of hemorrhoids   . Morbidly obese (HCC)   . Pre-diabetes   . Varicose vulva in pregnancy     Past Surgical History:  Procedure  Laterality Date  . CESAREAN SECTION N/A 04/27/2014   Procedure: CESAREAN SECTION;  Surgeon: Meriel Picaichard M Holland, MD;  Location: [facility];  Service: Obstetrics;  Laterality: N/A;  . CHOLECYSTECTOMY  2007  . COLONOSCOPY WITH PROPOFOL N/A 02/10/2016   Procedure: COLONOSCOPY WITH PROPOFOL;  Surgeon: Rachael Feeaniel P Jacobs, MD;  Location: [facility];  Service: Endoscopy;  Laterality: N/A;  . OVARIAN CYST DRAINAGE  2003    Social History   Social History  . Marital status: Married    Spouse name: N/A  . Number of children: 2  . Years of education: N/A   Occupational History  . human resources    Social History Main Topics  . Smoking status: Never Smoker  . Smokeless tobacco: Never Used  . Alcohol use No  . Drug use: No  . Sexual activity: Yes    Birth control/ protection: None   Other Topics Concern  . Not on file   Social History Narrative  . No narrative on file    Family History  Problem Relation Age of Onset  . Cardiomyopathy Father   . Heart failure Father   . Diabetes Father   . Hypertension Father   . Heart attack Father   . Hypertension Paternal Uncle   . Cancer Paternal Uncle         type unknown  .  Breast cancer Maternal Grandmother   . Anesthesia problems Neg Hx   . Hypotension Neg Hx   . Malignant hyperthermia Neg Hx   . Pseudochol deficiency Neg Hx      Review of Systems  Constitutional: Negative.  Negative for chills and fever.  HENT: Negative.  Negative for sore throat.   Eyes: Positive for discharge and redness.  Respiratory: Negative.  Negative for cough and shortness of breath.   Cardiovascular: Negative.  Negative for chest pain and palpitations.  Gastrointestinal: Negative.  Negative for abdominal pain, diarrhea, nausea and vomiting.  Genitourinary: Negative for dysuria and hematuria.  Musculoskeletal: Negative for myalgias and neck pain.  Skin: Positive for rash.  Neurological: Negative.  Negative for dizziness and headaches.  All other systems  reviewed and are negative.   Vitals:   12/18/16 1604  BP: 130/82  Pulse: 82  Resp: 18  Temp: 98.8 F (37.1 C)    Physical Exam  Constitutional: She is oriented to person, place, and time. She appears well-developed and well-nourished.  HENT:  Head: Normocephalic and atraumatic.  Eyes: EOM are normal. Pupils are equal, round, and reactive to light. Left conjunctiva is injected.  Neck: Normal range of motion. Neck supple. No JVD present.  Cardiovascular: Normal rate and regular rhythm.   Pulmonary/Chest: Effort normal and breath sounds normal.  Abdominal: There is no tenderness.  Musculoskeletal: Normal range of motion.  Left 4th finger: +erythema with open wounds c/w cellulitis; NVI with FROM  Lymphadenopathy:    She has no cervical adenopathy.  Neurological: She is alert and oriented to person, place, and time. No sensory deficit. She exhibits normal muscle tone.  Skin: Skin is warm and dry. Capillary refill takes less than 2 seconds.  Psychiatric: She has a normal mood and affect. Her behavior is normal.  Vitals reviewed.    ASSESSMENT & PLAN: Kayla [name] was seen today for rash and eye pain.  Diagnoses and all orders for this visit:  Cellulitis of finger of left hand -     WOUND CULTURE  Acute bacterial conjunctivitis of left eye -     tobramycin (TOBREX) 0.3 % ophthalmic solution; Place 2 drops into the left eye every 4 (four) hours.  Other orders -     cefadroxil (DURICEF) 500 MG capsule; Take 1 capsule (500 mg total) by mouth 2 (two) times daily. -     mupirocin ointment (BACTROBAN) 2 %; Apply 1 application topically 2 (two) times daily.    Patient Instructions      Bacterial Conjunctivitis Bacterial conjunctivitis is an infection of your conjunctiva. This is the clear membrane that covers the white part of your eye and the inner surface of your eyelid. This condition can make your eye:  Red or pink.  Itchy.  This condition is caused by bacteria. This  condition spreads very easily from person to person (is contagious) and from one eye to the other eye. Follow these instructions at home: Medicines  Take or apply your antibiotic medicine as told by your doctor. Do not stop taking or applying the antibiotic even if you start to feel better.  Take or apply over-the-counter and prescription medicines only as told by your doctor.  Do not touch your eyelid with the eye drop bottle or the ointment tube. Managing discomfort  Wipe any fluid from your eye with a warm, wet washcloth or a cotton ball.  Place a cool, clean washcloth on your eye. Do this for 10-20 minutes, 3-4 times per day.  General instructions  Do not wear contact lenses until the irritation is gone. Wear glasses until your doctor says it is okay to wear contacts.  Do not wear eye makeup until your symptoms are gone. Throw away any old makeup.  Change or wash your pillowcase every day.  Do not share towels or washcloths with anyone.  Wash your hands often with soap and water. Use paper towels to dry your hands.  Do not touch or rub your eyes.  Do not drive or use heavy machinery if your vision is blurry. Contact a doctor if:  You have a fever.  Your symptoms do not get better after 10 days. Get help right away if:  You have a fever and your symptoms suddenly get worse.  You have very bad pain when you move your eye.  Your face: ? Hurts. ? Is red. ? Is swollen.  You have sudden loss of vision. This information is not intended to replace advice given to you by your health care provider. Make sure you discuss any questions you have with your health care provider. Document Released: 03/28/2008 Document Revised: 11/25/2015 Document Reviewed: 04/01/2015 Elsevier Interactive Patient Education  2018 [employer].  Cellulitis, Adult Cellulitis is a skin infection. The infected area is usually red and sore. This condition occurs most often in the arms and lower legs.  It is very important to get treated for this condition. Follow these instructions at home:  Take over-the-counter and prescription medicines only as told by your doctor.  If you were prescribed an antibiotic medicine, take it as told by your doctor. Do not stop taking the antibiotic even if you start to feel better.  Drink enough fluid to keep your pee (urine) clear or pale yellow.  Do not touch or rub the infected area.  Raise (elevate) the infected area above the level of your heart while you are sitting or lying down.  Place warm or cold wet cloths (warm or cold compresses) on the infected area. Do this as told by your doctor.  Keep all follow-up visits as told by your doctor. This is important. These visits let your doctor make sure your infection is not getting worse. Contact a doctor if:  You have a fever.  Your symptoms do not get better after 1-2 days of treatment.  Your bone or joint under the infected area starts to hurt after the skin has healed.  Your infection comes back. This can happen in the same area or another area.  You have a swollen bump in the infected area.  You have new symptoms.  You feel ill and also have muscle aches and pains. Get help right away if:  Your symptoms get worse.  You feel very sleepy.  You throw up (vomit) or have watery poop (diarrhea) for a long time.  There are red streaks coming from the infected area.  Your red area gets larger.  Your red area turns darker. This information is not intended to replace advice given to you by your health care provider. Make sure you discuss any questions you have with your health care provider. Document Released: 12/06/2007 Document Revised: 11/25/2015 Document Reviewed: 04/28/2015 Elsevier Interactive Patient Education  2018 [employer].   IF you received an x-ray today, you will receive an invoice from [facility]. Please contact [facility] at [phone] with  questions or concerns regarding your invoice.   IF you received labwork today, you will receive an invoice from [employer] [facility]. Please contact [facility]  at [phone] with questions or concerns regarding your invoice.   Our billing staff will not be able to assist you with questions regarding bills from these companies.  You will be contacted with the lab results as soon as they are available. The fastest way to get your results is to activate your My Chart account. Instructions are located on the last page of this paperwork. If you have not heard from [place] regarding the results in 2 weeks, please contact this office.         Edwina Barth, MD [facility]

## 2016-12-18 NOTE — Patient Instructions (Addendum)
Bacterial Conjunctivitis Bacterial conjunctivitis is an infection of your conjunctiva. This is the clear membrane that covers the white part of your eye and the inner surface of your eyelid. This condition can make your eye:  Red or pink.  Itchy.  This condition is caused by bacteria. This condition spreads very easily from person to person (is contagious) and from one eye to the other eye. Follow these instructions at home: Medicines  Take or apply your antibiotic medicine as told by your doctor. Do not stop taking or applying the antibiotic even if you start to feel better.  Take or apply over-the-counter and prescription medicines only as told by your doctor.  Do not touch your eyelid with the eye drop bottle or the ointment tube. Managing discomfort  Wipe any fluid from your eye with a warm, wet washcloth or a cotton ball.  Place a cool, clean washcloth on your eye. Do this for 10-20 minutes, 3-4 times per day. General instructions  Do not wear contact lenses until the irritation is gone. Wear glasses until your doctor says it is okay to wear contacts.  Do not wear eye makeup until your symptoms are gone. Throw away any old makeup.  Change or wash your pillowcase every day.  Do not share towels or washcloths with anyone.  Wash your hands often with soap and water. Use paper towels to dry your hands.  Do not touch or rub your eyes.  Do not drive or use heavy machinery if your vision is blurry. Contact a doctor if:  You have a fever.  Your symptoms do not get better after 10 days. Get help right away if:  You have a fever and your symptoms suddenly get worse.  You have very bad pain when you move your eye.  Your face: ? Hurts. ? Is red. ? Is swollen.  You have sudden loss of vision. This information is not intended to replace advice given to you by your health care provider. Make sure you discuss any questions you have with your health care  provider. Document Released: 03/28/2008 Document Revised: 11/25/2015 Document Reviewed: 04/01/2015 Elsevier Interactive Patient Education  2018 [employer].  Cellulitis, Adult Cellulitis is a skin infection. The infected area is usually red and sore. This condition occurs most often in the arms and lower legs. It is very important to get treated for this condition. Follow these instructions at home:  Take over-the-counter and prescription medicines only as told by your doctor.  If you were prescribed an antibiotic medicine, take it as told by your doctor. Do not stop taking the antibiotic even if you start to feel better.  Drink enough fluid to keep your pee (urine) clear or pale yellow.  Do not touch or rub the infected area.  Raise (elevate) the infected area above the level of your heart while you are sitting or lying down.  Place warm or cold wet cloths (warm or cold compresses) on the infected area. Do this as told by your doctor.  Keep all follow-up visits as told by your doctor. This is important. These visits let your doctor make sure your infection is not getting worse. Contact a doctor if:  You have a fever.  Your symptoms do not get better after 1-2 days of treatment.  Your bone or joint under the infected area starts to hurt after the skin has healed.  Your infection comes back. This can happen in the same area or another area.  You  have a swollen bump in the infected area.  You have new symptoms.  You feel ill and also have muscle aches and pains. Get help right away if:  Your symptoms get worse.  You feel very sleepy.  You throw up (vomit) or have watery poop (diarrhea) for a long time.  There are red streaks coming from the infected area.  Your red area gets larger.  Your red area turns darker. This information is not intended to replace advice given to you by your health care provider. Make sure you discuss any questions you have with your health  care provider. Document Released: 12/06/2007 Document Revised: 11/25/2015 Document Reviewed: 04/28/2015 Elsevier Interactive Patient Education  2018 [employer]   IF you received an x-ray today, you will receive an invoice from [facility]. Please contact [facility] at [phone] with questions or concerns regarding your invoice.   IF you received labwork today, you will receive an invoice from [facility]. Please contact [facility] at [phone] with questions or concerns regarding your invoice.   Our billing staff will not be able to assist you with questions regarding bills from these companies.  You will be contacted with the lab results as soon as they are available. The fastest way to get your results is to activate your My Chart account. Instructions are located on the last page of this paperwork. If you have not heard from us regarding the results in 2 weeks, please contact this office.

## 2016-12-21 LAB — WOUND CULTURE

## 2016-12-22 ENCOUNTER — Encounter: Payer: Self-pay | Admitting: Radiology

## 2016-12-27 ENCOUNTER — Telehealth: Payer: Self-pay | Admitting: *Deleted

## 2016-12-27 ENCOUNTER — Ambulatory Visit (INDEPENDENT_AMBULATORY_CARE_PROVIDER_SITE_OTHER): Payer: 59 | Admitting: Emergency Medicine

## 2016-12-27 ENCOUNTER — Encounter: Payer: Self-pay | Admitting: Emergency Medicine

## 2016-12-27 VITALS — BP 131/86 | HR 87 | Temp 98.0°F | Resp 17 | Ht 64.0 in | Wt 362.0 lb

## 2016-12-27 DIAGNOSIS — L02412 Cutaneous abscess of left axilla: Secondary | ICD-10-CM | POA: Diagnosis not present

## 2016-12-27 DIAGNOSIS — L298 Other pruritus: Secondary | ICD-10-CM | POA: Diagnosis not present

## 2016-12-27 DIAGNOSIS — L03112 Cellulitis of left axilla: Secondary | ICD-10-CM | POA: Diagnosis not present

## 2016-12-27 DIAGNOSIS — N898 Other specified noninflammatory disorders of vagina: Secondary | ICD-10-CM | POA: Insufficient documentation

## 2016-12-27 MED ORDER — DOXYCYCLINE HYCLATE 100 MG PO TABS: 100.0000 mg | ORAL_TABLET | Freq: Two times a day (BID) | ORAL | 0 refills | Status: DC

## 2016-12-27 MED ORDER — TRAMADOL HCL 50 MG PO TABS: 50.0000 mg | ORAL_TABLET | Freq: Three times a day (TID) | ORAL | 0 refills | Status: DC | PRN

## 2016-12-27 MED ORDER — FLUCONAZOLE 150 MG PO TABS: 150.0000 mg | ORAL_TABLET | Freq: Once | ORAL | 0 refills | Status: AC

## 2016-12-27 NOTE — Patient Instructions (Addendum)
     IF you received an x-ray today, you will receive an invoice from [facility]. Please contact [facility] at [phone] with questions or concerns regarding your invoice.   IF you received labwork today, you will receive an invoice from [facility]. Please contact [facility] at [phone] with questions or concerns regarding your invoice.   Our billing staff will not be able to assist you with questions regarding bills from these companies.  You will be contacted with the lab results as soon as they are available. The fastest way to get your results is to activate your My Chart account. Instructions are located on the last page of this paperwork. If you have not heard from us regarding the results in 2 weeks, please contact this office.     Incision and Drainage, Care After Refer to this sheet in the next few weeks. These instructions provide you with information about caring for yourself after your procedure. Your health care provider may also give you more specific instructions. Your treatment has been planned according to current medical practices, but problems sometimes occur. Call your health care provider if you have any problems or questions after your procedure. What can I expect after the procedure? After the procedure, it is common to have:  Pain or discomfort around your incision site.  Drainage from your incision.  Follow these instructions at home:  Take over-the-counter and prescription medicines only as told by your health care provider.  If you were prescribed an antibiotic medicine, take it as told by your health care provider.Do not stop taking the antibiotic even if you start to feel better.  Followinstructions from your health care provider about: ? How to take care of your incision. ? When and how you should change your packing and bandage (dressing). Wash your hands with soap and water before you change your dressing. If soap and water are  not available, use hand sanitizer. ? When you should remove your dressing.  Do not take baths, swim, or use a hot tub until your health care provider approves.  Keep all follow-up visits as told by your health care provider. This is important.  Check your incision area every day for signs of infection. Check for: ? More redness, swelling, or pain. ? More fluid or blood. ? Warmth. ? Pus or a bad smell. Contact a health care provider if:  Your cyst or abscess returns.  You have a fever.  You have more redness, swelling, or pain around your incision.  You have more fluid or blood coming from your incision.  Your incision feels warm to the touch.  You have pus or a bad smell coming from your incision. Get help right away if:  You have severe pain or bleeding.  You cannot eat or drink without vomiting.  You have decreased urine output.  You become short of breath.  You have chest pain.  You cough up blood.  The area where the incision and drainage occurred becomes numb or it tingles. This information is not intended to replace advice given to you by your health care provider. Make sure you discuss any questions you have with your health care provider. Document Released: 09/11/2011 Document Revised: 11/19/2015 Document Reviewed: 04/09/2015 Elsevier Interactive Patient Education  2017 [employer] Inc.  

## 2016-12-27 NOTE — Progress Notes (Signed)
Kayla [name] 36 y.o.   Chief Complaint  Patient presents with  . boil under arm    HISTORY OF PRESENT ILLNESS: This is a 36 y.o. female complaining of painful boil under left arm x 3-5 days. Seen by me 6/18 with finger cellulitis; culture grew MSSA; responded to [facility]; now has vaginal itching.  HPI   Prior to Admission medications   Medication Sig Start Date End Date Taking? Authorizing Provider  Cholecalciferol (VITAMIN D) 2000 units CAPS Take 1 capsule by mouth daily.   Yes [provider]  mupirocin ointment (BACTROBAN) 2 % Apply 1 application topically 2 (two) times daily. 12/18/16  Yes Merilynn Haydu, Eilleen Kempf, MD  pramoxine-hydrocortisone [facility]) cream Apply topically 3 (three) times daily.   Yes [provider]  tobramycin (TOBREX) 0.3 % ophthalmic solution Place 2 drops into the left eye every 4 (four) hours. 12/18/16  Yes Alfred Eckley, Eilleen Kempf, MD  doxycycline (VIBRA-TABS) 100 MG tablet Take 1 tablet (100 mg total) by mouth 2 (two) times daily. 12/27/16   Georgina Quint, MD  fluconazole (DIFLUCAN) 150 MG tablet Take 1 tablet (150 mg total) by mouth once. 12/27/16 12/27/16  Georgina Quint, MD  traMADol (ULTRAM) 50 MG tablet Take 1 tablet (50 mg total) by mouth every 8 (eight) hours as needed. 12/27/16   Georgina Quint, MD    Allergies  Allergen Reactions  . Bactrim Nausea And Vomiting and Other (See Comments)    Overall makes her feel "horrible"  Body aches n/v, HA    Patient Active Problem List   Diagnosis Date Noted  . Abscess of axilla, left 12/27/2016  . Cellulitis of axilla, left 12/27/2016  . Vaginal itching 12/27/2016  . Cellulitis of finger of left hand 12/18/2016  . Acute bacterial conjunctivitis of left eye 12/18/2016  . Pregnancy 04/27/2014  . Advanced maternal age (AMA) in pregnancy 02/13/2014  . Obesity in pregnancy, antepartum 02/13/2014    Past Medical History:  Diagnosis Date  . Abnormal Pap smear   .  Headache(784.0)   . History of chlamydia   . History of hemorrhoids   . Morbidly obese (HCC)   . Pre-diabetes   . Varicose vulva in pregnancy     Past Surgical History:  Procedure Laterality Date  . CESAREAN SECTION N/A 04/27/2014   Procedure: CESAREAN SECTION;  Surgeon: Meriel Pica, MD;  Location: [facility];  Service: Obstetrics;  Laterality: N/A;  . CHOLECYSTECTOMY  2007  . COLONOSCOPY WITH PROPOFOL N/A 02/10/2016   Procedure: COLONOSCOPY WITH PROPOFOL;  Surgeon: Rachael Fee, MD;  Location: [facility];  Service: Endoscopy;  Laterality: N/A;  . OVARIAN CYST DRAINAGE  2003    Social History   Social History  . Marital status: Married    Spouse name: N/A  . Number of children: 2  . Years of education: N/A   Occupational History  . human resources    Social History Main Topics  . Smoking status: Never Smoker  . Smokeless tobacco: Never Used  . Alcohol use No  . Drug use: No  . Sexual activity: Yes    Birth control/ protection: None   Other Topics Concern  . Not on file   Social History Narrative  . No narrative on file    Family History  Problem Relation Age of Onset  . Cardiomyopathy Father   . Heart failure Father   . Diabetes Father   . Hypertension Father   . Heart attack Father   .  Hypertension Paternal Uncle   . Cancer Paternal Uncle         type unknown  . Breast cancer Maternal Grandmother   . Anesthesia problems Neg Hx   . Hypotension Neg Hx   . Malignant hyperthermia Neg Hx   . Pseudochol deficiency Neg Hx      Review of Systems  Constitutional: Negative.  Negative for chills and fever.  Respiratory: Negative for cough and shortness of breath.   Cardiovascular: Negative for chest pain and palpitations.  Gastrointestinal: Negative for nausea and vomiting.  Endo/Heme/Allergies: Negative.   All other systems reviewed and are negative.  Vitals:   12/27/16 1503  BP: 131/86  Pulse: 87  Resp: 17  Temp: 98 F (36.7 C)      Physical Exam  Constitutional: She is oriented to person, place, and time. She appears well-developed and well-nourished.  HENT:  Head: Normocephalic and atraumatic.  Eyes: Pupils are equal, round, and reactive to light.  Neck: Normal range of motion. Neck supple.  Cardiovascular: Normal rate and regular rhythm.   Pulmonary/Chest: Effort normal.  Abdominal: Soft.  Musculoskeletal: Normal range of motion.  Neurological: She is alert and oriented to person, place, and time.  Skin: Skin is warm. Capillary refill takes less than 2 seconds.  Left axilla: tender and fluctuant mass with purulent  drainage coming out of small skin opening.  Psychiatric: She has a normal mood and affect.  Vitals reviewed.  PROCEDURE NOTE: I&D of Abscess Verbal consent obtained. Local anesthesia with 7cc of 2% lidocaine with epi. Site cleansed with Betadine.  Incision of 2cm was made using a 11 blade, discharge of copious amounts of pus and serosanguinous fluid. Wound cavity was explored with curved hemostats and aggressively packed with 1/2" plain packing. Cleansed and dressed. After care instructions provided. Patient to return to clinic on 6/29 for reevaluation/repacking.       ASSESSMENT & PLAN: Kayla [name] was seen today for boil under arm.  Diagnoses and all orders for this visit:  Abscess of axilla, left -     WOUND CULTURE -     traMADol (ULTRAM) 50 MG tablet; Take 1 tablet (50 mg total) by mouth every 8 (eight) hours as needed.  Cellulitis of axilla, left -     doxycycline (VIBRA-TABS) 100 MG tablet; Take 1 tablet (100 mg total) by mouth 2 (two) times daily.  Vaginal itching -     fluconazole (DIFLUCAN) 150 MG tablet; Take 1 tablet (150 mg total) by mouth once.    Patient Instructions       IF you received an x-ray today, you will receive an invoice from [facility]. Please contact [facility] at [phone] with questions or concerns regarding your invoice.    IF you received labwork today, you will receive an invoice from [facility]. Please contact [facility] at [phone] with questions or concerns regarding your invoice.   Our billing staff will not be able to assist you with questions regarding bills from these companies.  You will be contacted with the lab results as soon as they are available. The fastest way to get your results is to activate your My Chart account. Instructions are located on the last page of this paperwork. If you have not heard from [place] regarding the results in 2 weeks, please contact this office.      Incision and Drainage, Care After Refer to this sheet in the next few weeks. These instructions provide you with information about caring for yourself after your  procedure. Your health care provider may also give you more specific instructions. Your treatment has been planned according to current medical practices, but problems sometimes occur. Call your health care provider if you have any problems or questions after your procedure. What can I expect after the procedure? After the procedure, it is common to have:  Pain or discomfort around your incision site.  Drainage from your incision.  Follow these instructions at home:  Take over-the-counter and prescription medicines only as told by your health care provider.  If you were prescribed an antibiotic medicine, take it as told by your health care provider.Do not stop taking the antibiotic even if you start to feel better.  Followinstructions from your health care provider about: ? How to take care of your incision. ? When and how you should change your packing and bandage (dressing). Wash your hands with soap and water before you change your dressing. If soap and water are not available, use hand sanitizer. ? When you should remove your dressing.  Do not take baths, swim, or use a hot tub until your health care provider approves.  Keep all follow-up visits as  told by your health care provider. This is important.  Check your incision area every day for signs of infection. Check for: ? More redness, swelling, or pain. ? More fluid or blood. ? Warmth. ? Pus or a bad smell. Contact a health care provider if:  Your cyst or abscess returns.  You have a fever.  You have more redness, swelling, or pain around your incision.  You have more fluid or blood coming from your incision.  Your incision feels warm to the touch.  You have pus or a bad smell coming from your incision. Get help right away if:  You have severe pain or bleeding.  You cannot eat or drink without vomiting.  You have decreased urine output.  You become short of breath.  You have chest pain.  You cough up blood.  The area where the incision and drainage occurred becomes numb or it tingles. This information is not intended to replace advice given to you by your health care provider. Make sure you discuss any questions you have with your health care provider. Document Released: 09/11/2011 Document Revised: 11/19/2015 Document Reviewed: 04/09/2015 Elsevier Interactive Patient Education  2017 Elsevier Inc.     Edwina BarthMiguel Giulianna Rocha, MD [facility]

## 2016-12-27 NOTE — Telephone Encounter (Signed)
Tramadol 50 mg tablet [facility], per Dr Alvy BimlerSagardia. Patient was waiting at the pharmacy.

## 2016-12-29 ENCOUNTER — Encounter: Payer: Self-pay | Admitting: Family Medicine

## 2016-12-29 ENCOUNTER — Ambulatory Visit (INDEPENDENT_AMBULATORY_CARE_PROVIDER_SITE_OTHER): Payer: 59 | Admitting: Family Medicine

## 2016-12-29 VITALS — BP 116/77 | HR 85 | Temp 99.4°F | Resp 16 | Ht 64.0 in | Wt 363.8 lb

## 2016-12-29 DIAGNOSIS — L02419 Cutaneous abscess of limb, unspecified: Secondary | ICD-10-CM

## 2016-12-29 NOTE — Progress Notes (Signed)
Patient ID: Kayla [name], female    DOB: 09-01-1980  Age: 36 y.o. MRN: [id number]  Chief Complaint  Patient presents with  . follow up    wound left axilla    Subjective:   Here for recheck of her left axillary abscess. She is doing well. The dressing came off before she went home and her husband readjusted for her.  Current allergies, medications, problem list, past/family and social histories reviewed.  Objective:  BP 116/77   Pulse 85   Temp 99.4 F (37.4 C) (Oral)   Resp 16   Ht 5\' 4"  (1.626 m)   Wt (!) 363 lb 12.8 oz (165 kg)   LMP 12/09/2016   SpO2 97%   BMI 62.45 kg/m   Wound looks good. Some pus still expresses. Are removed about a foot of packing and left the tail of packing still in it. She is to return in 3 days for recheck of it.  Assessment & Plan:   Assessment: 1. Axillary abscess       Plan:   No orders of the defined types were placed in this encounter.   No orders of the defined types were placed in this encounter.        Patient Instructions   Return Monday for recheck. Try to keep it dressed and dry.    IF you received an x-ray today, you will receive an invoice from [facility]. Please contact [facility] at [phone] with questions or concerns regarding your invoice.   IF you received labwork today, you will receive an invoice from [facility]. Please contact [facility] at [phone] with questions or concerns regarding your invoice.   Our billing staff will not be able to assist you with questions regarding bills from these companies.  You will be contacted with the lab results as soon as they are available. The fastest way to get your results is to activate your My Chart account. Instructions are located on the last page of this paperwork. If you have not heard from us regarding the results in 2 weeks, please contact this office.        Return in about 3 days (around 01/01/2017) for packing removal and  dressing change.   Taline Nass, MD 12/29/2016

## 2016-12-29 NOTE — Patient Instructions (Addendum)
Return Monday for recheck. Try to keep it dressed and dry.    IF you received an x-ray today, you will receive an invoice from [facility]. Please contact [facility] at [phone] with questions or concerns regarding your invoice.   IF you received labwork today, you will receive an invoice from [facility]. Please contact [facility] at [phone] with questions or concerns regarding your invoice.   Our billing staff will not be able to assist you with questions regarding bills from these companies.  You will be contacted with the lab results as soon as they are available. The fastest way to get your results is to activate your My Chart account. Instructions are located on the last page of this paperwork. If you have not heard from us regarding the results in 2 weeks, please contact this office.

## 2016-12-30 LAB — WOUND CULTURE

## 2017-01-01 ENCOUNTER — Encounter: Payer: Self-pay | Admitting: Emergency Medicine

## 2017-01-01 ENCOUNTER — Ambulatory Visit (INDEPENDENT_AMBULATORY_CARE_PROVIDER_SITE_OTHER): Payer: 59 | Admitting: Emergency Medicine

## 2017-01-01 VITALS — BP 113/72 | HR 79 | Temp 100.1°F | Resp 16 | Ht 64.5 in | Wt 362.2 lb

## 2017-01-01 DIAGNOSIS — L03112 Cellulitis of left axilla: Secondary | ICD-10-CM

## 2017-01-01 DIAGNOSIS — L02412 Cutaneous abscess of left axilla: Secondary | ICD-10-CM

## 2017-01-01 MED ORDER — AMOXICILLIN-POT CLAVULANATE 875-125 MG PO TABS: 1.0000 | ORAL_TABLET | Freq: Two times a day (BID) | ORAL | 0 refills | Status: AC

## 2017-01-01 NOTE — Progress Notes (Signed)
Kayla [name] 36 y.o.   Chief Complaint  Patient presents with  . Follow-up    wound care - L Axilla    HISTORY OF PRESENT ILLNESS: This is a 36 y.o. female seen by me 6/27 for left axillary abscess; I&D and started on Doxycycline; culture grew Proteus species resistant to Tetracyclines. Doing well; feels better.  HPI   Prior to Admission medications   Medication Sig Start Date End Date Taking? Authorizing Provider  Cholecalciferol (VITAMIN D) 2000 units CAPS Take 1 capsule by mouth daily.   Yes [provider]  doxycycline (VIBRA-TABS) 100 MG tablet Take 1 tablet (100 mg total) by mouth 2 (two) times daily. 12/27/16  Yes Kayla [name], Kayla Kempf, MD  mupirocin ointment (BACTROBAN) 2 % Apply 1 application topically 2 (two) times daily. 12/18/16  Yes Kayla [name], Kayla Kempf, MD  pramoxine-hydrocortisone [facility]) cream Apply topically 3 (three) times daily.   Yes [provider]  tobramycin (TOBREX) 0.3 % ophthalmic solution Place 2 drops into the left eye every 4 (four) hours. 12/18/16  Yes Kayla [name], Kayla Kempf, MD  traMADol (ULTRAM) 50 MG tablet Take 1 tablet (50 mg total) by mouth every 8 (eight) hours as needed. 12/27/16  Yes SagardiaEilleen Kempf, MD  amoxicillin-clavulanate (AUGMENTIN) 875-125 MG tablet Take 1 tablet by mouth 2 (two) times daily. 01/01/17 01/08/17  Kayla Quint, MD    Allergies  Allergen Reactions  . Bactrim Nausea And Vomiting and Other (See Comments)    Overall makes her feel "horrible"  Body aches n/v, HA    Patient Active Problem List   Diagnosis Date Noted  . Abscess of axilla, left 12/27/2016  . Cellulitis of axilla, left 12/27/2016  . Vaginal itching 12/27/2016  . Cellulitis of finger of left hand 12/18/2016  . Acute bacterial conjunctivitis of left eye 12/18/2016  . Pregnancy 04/27/2014  . Advanced maternal age (AMA) in pregnancy 02/13/2014  . Obesity in pregnancy, antepartum 02/13/2014    Past Medical History:    Diagnosis Date  . Abnormal Pap smear   . Headache(784.0)   . History of chlamydia   . History of hemorrhoids   . Morbidly obese (HCC)   . Pre-diabetes   . Varicose vulva in pregnancy     Past Surgical History:  Procedure Laterality Date  . CESAREAN SECTION N/A 04/27/2014   Procedure: CESAREAN SECTION;  Surgeon: Kayla Pica, MD;  Location: [facility];  Service: Obstetrics;  Laterality: N/A;  . CHOLECYSTECTOMY  2007  . COLONOSCOPY WITH PROPOFOL N/A 02/10/2016   Procedure: COLONOSCOPY WITH PROPOFOL;  Surgeon: Kayla Fee, MD;  Location: [facility];  Service: Endoscopy;  Laterality: N/A;  . OVARIAN CYST DRAINAGE  2003    Social History   Social History  . Marital status: Married    Spouse name: N/A  . Number of children: 2  . Years of education: N/A   Occupational History  . human resources    Social History Main Topics  . Smoking status: Never Smoker  . Smokeless tobacco: Never Used  . Alcohol use No  . Drug use: No  . Sexual activity: Yes    Birth control/ protection: None   Other Topics Concern  . Not on file   Social History Narrative  . No narrative on file    Family History  Problem Relation Age of Onset  . Cardiomyopathy Father   . Heart failure Father   . Diabetes Father   . Hypertension Father   . Heart  attack Father   . Hypertension Paternal Uncle   . Cancer Paternal Uncle         type unknown  . Breast cancer Maternal Grandmother   . Anesthesia problems Neg Hx   . Hypotension Neg Hx   . Malignant hyperthermia Neg Hx   . Pseudochol deficiency Neg Hx      Review of Systems  Constitutional: Negative.  Negative for chills and fever.  Respiratory: Negative for cough.   Gastrointestinal: Negative for abdominal pain, diarrhea, nausea and vomiting.  Skin: Negative.  Negative for rash.  Neurological: Negative for dizziness and headaches.  All other systems reviewed and are negative.  Vitals:   01/01/17 1632  BP: 113/72  Pulse: 79   Resp: 16  Temp: 100.1 F (37.8 C)     Physical Exam  Constitutional: She is oriented to person, place, and time. She appears well-developed and well-nourished.  HENT:  Head: Normocephalic and atraumatic.  Eyes: Pupils are equal, round, and reactive to light.  Neck: Normal range of motion.  Cardiovascular: Normal rate.   Pulmonary/Chest: Effort normal.  Musculoskeletal: Normal range of motion.  Neurological: She is alert and oriented to person, place, and time.  Skin: Skin is warm and dry. Capillary refill takes less than 2 seconds.  Left axillary area: less erythema but still some induration present; packing removed; still draining. Re-packed and covered.  Psychiatric: She has a normal mood and affect. Her behavior is normal.  Vitals reviewed.    ASSESSMENT & PLAN: Kayla PolingFranchesca was seen today for follow-up.  Diagnoses and all orders for this visit:  Abscess of axilla, left  Cellulitis of axilla, left  Other orders -     amoxicillin-clavulanate (AUGMENTIN) 875-125 MG tablet; Take 1 tablet by mouth 2 (two) times daily.    Patient Instructions   Stop Doxycycline and start Augmentin. F/U 01/04/17.  Incision and Drainage, Care After Refer to this sheet in the next few weeks. These instructions provide you with information about caring for yourself after your procedure. Your health care provider may also give you more specific instructions. Your treatment has been planned according to current medical practices, but problems sometimes occur. Call your health care provider if you have any problems or questions after your procedure. What can I expect after the procedure? After the procedure, it is common to have:  Pain or discomfort around your incision site.  Drainage from your incision.  Follow these instructions at home:  Take over-the-counter and prescription medicines only as told by your health care provider.  If you were prescribed an antibiotic medicine, take it as  told by your health care provider.Do not stop taking the antibiotic even if you start to feel better.  Followinstructions from your health care provider about: ? How to take care of your incision. ? When and how you should change your packing and bandage (dressing). Wash your hands with soap and water before you change your dressing. If soap and water are not available, use hand sanitizer. ? When you should remove your dressing.  Do not take baths, swim, or use a hot tub until your health care provider approves.  Keep all follow-up visits as told by your health care provider. This is important.  Check your incision area every day for signs of infection. Check for: ? More redness, swelling, or pain. ? More fluid or blood. ? Warmth. ? Pus or a bad smell. Contact a health care provider if:  Your cyst or abscess returns.  You  have a fever.  You have more redness, swelling, or pain around your incision.  You have more fluid or blood coming from your incision.  Your incision feels warm to the touch.  You have pus or a bad smell coming from your incision. Get help right away if:  You have severe pain or bleeding.  You cannot eat or drink without vomiting.  You have decreased urine output.  You become short of breath.  You have chest pain.  You cough up blood.  The area where the incision and drainage occurred becomes numb or it tingles. This information is not intended to replace advice given to you by your health care provider. Make sure you discuss any questions you have with your health care provider. Document Released: 09/11/2011 Document Revised: 11/19/2015 Document Reviewed: 04/09/2015 Elsevier Interactive Patient Education  2017 [employer].    IF you received an x-ray today, you will receive an invoice from [facility]. Please contact [facility] at [phone] with questions or concerns regarding your invoice.   IF you received labwork  today, you will receive an invoice from [facility]. Please contact [facility] at [phone] with questions or concerns regarding your invoice.   Our billing staff will not be able to assist you with questions regarding bills from these companies.  You will be contacted with the lab results as soon as they are available. The fastest way to get your results is to activate your My Chart account. Instructions are located on the last page of this paperwork. If you have not heard from [place] regarding the results in 2 weeks, please contact this office.         Edwina Barth, MD [facility]

## 2017-01-01 NOTE — Patient Instructions (Addendum)
Stop Doxycycline and start Augmentin. F/U 01/04/17.  Incision and Drainage, Care After Refer to this sheet in the next few weeks. These instructions provide you with information about caring for yourself after your procedure. Your health care provider may also give you more specific instructions. Your treatment has been planned according to current medical practices, but problems sometimes occur. Call your health care provider if you have any problems or questions after your procedure. What can I expect after the procedure? After the procedure, it is common to have:  Pain or discomfort around your incision site.  Drainage from your incision.  Follow these instructions at home:  Take over-the-counter and prescription medicines only as told by your health care provider.  If you were prescribed an antibiotic medicine, take it as told by your health care provider.Do not stop taking the antibiotic even if you start to feel better.  Followinstructions from your health care provider about: ? How to take care of your incision. ? When and how you should change your packing and bandage (dressing). Wash your hands with soap and water before you change your dressing. If soap and water are not available, use hand sanitizer. ? When you should remove your dressing.  Do not take baths, swim, or use a hot tub until your health care provider approves.  Keep all follow-up visits as told by your health care provider. This is important.  Check your incision area every day for signs of infection. Check for: ? More redness, swelling, or pain. ? More fluid or blood. ? Warmth. ? Pus or a bad smell. Contact a health care provider if:  Your cyst or abscess returns.  You have a fever.  You have more redness, swelling, or pain around your incision.  You have more fluid or blood coming from your incision.  Your incision feels warm to the touch.  You have pus or a bad smell coming from your  incision. Get help right away if:  You have severe pain or bleeding.  You cannot eat or drink without vomiting.  You have decreased urine output.  You become short of breath.  You have chest pain.  You cough up blood.  The area where the incision and drainage occurred becomes numb or it tingles. This information is not intended to replace advice given to you by your health care provider. Make sure you discuss any questions you have with your health care provider. Document Released: 09/11/2011 Document Revised: 11/19/2015 Document Reviewed: 04/09/2015 Elsevier Interactive Patient Education  2017 [employer]    IF you received an x-ray today, you will receive an invoice from [facility]. Please contact [facility] at [phone] with questions or concerns regarding your invoice.   IF you received labwork today, you will receive an invoice from [facility]. Please contact [facility] at [phone] with questions or concerns regarding your invoice.   Our billing staff will not be able to assist you with questions regarding bills from these companies.  You will be contacted with the lab results as soon as they are available. The fastest way to get your results is to activate your My Chart account. Instructions are located on the last page of this paperwork. If you have not heard from us regarding the results in 2 weeks, please contact this office.

## 2017-01-04 ENCOUNTER — Encounter: Payer: Self-pay | Admitting: Emergency Medicine

## 2017-01-04 ENCOUNTER — Ambulatory Visit (INDEPENDENT_AMBULATORY_CARE_PROVIDER_SITE_OTHER): Payer: 59 | Admitting: Emergency Medicine

## 2017-01-04 VITALS — BP 130/84 | HR 81 | Temp 98.5°F | Resp 18 | Ht 64.5 in | Wt 377.6 lb

## 2017-01-04 DIAGNOSIS — R21 Rash and other nonspecific skin eruption: Secondary | ICD-10-CM

## 2017-01-04 DIAGNOSIS — L02412 Cutaneous abscess of left axilla: Secondary | ICD-10-CM

## 2017-01-04 DIAGNOSIS — L03112 Cellulitis of left axilla: Secondary | ICD-10-CM

## 2017-01-04 MED ORDER — PREDNISONE 20 MG PO TABS: 40.0000 mg | ORAL_TABLET | Freq: Every day | ORAL | 0 refills | Status: AC

## 2017-01-04 NOTE — Assessment & Plan Note (Addendum)
Most likely due to Germ Shield cream. Will start Prednisone x 5 days.

## 2017-01-04 NOTE — Assessment & Plan Note (Signed)
Much improved

## 2017-01-04 NOTE — Patient Instructions (Addendum)
     IF you received an x-ray today, you will receive an invoice from [facility]. Please contact [facility] at [phone] with questions or concerns regarding your invoice.   IF you received labwork today, you will receive an invoice from [facility]. Please contact [facility] at [phone] with questions or concerns regarding your invoice.   Our billing staff will not be able to assist you with questions regarding bills from these companies.  You will be contacted with the lab results as soon as they are available. The fastest way to get your results is to activate your My Chart account. Instructions are located on the last page of this paperwork. If you have not heard from us regarding the results in 2 weeks, please contact this office.     Incision and Drainage, Care After Refer to this sheet in the next few weeks. These instructions provide you with information about caring for yourself after your procedure. Your health care provider may also give you more specific instructions. Your treatment has been planned according to current medical practices, but problems sometimes occur. Call your health care provider if you have any problems or questions after your procedure. What can I expect after the procedure? After the procedure, it is common to have:  Pain or discomfort around your incision site.  Drainage from your incision.  Follow these instructions at home:  Take over-the-counter and prescription medicines only as told by your health care provider.  If you were prescribed an antibiotic medicine, take it as told by your health care provider.Do not stop taking the antibiotic even if you start to feel better.  Followinstructions from your health care provider about: ? How to take care of your incision. ? When and how you should change your packing and bandage (dressing). Wash your hands with soap and water before you change your dressing. If soap and water are  not available, use hand sanitizer. ? When you should remove your dressing.  Do not take baths, swim, or use a hot tub until your health care provider approves.  Keep all follow-up visits as told by your health care provider. This is important.  Check your incision area every day for signs of infection. Check for: ? More redness, swelling, or pain. ? More fluid or blood. ? Warmth. ? Pus or a bad smell. Contact a health care provider if:  Your cyst or abscess returns.  You have a fever.  You have more redness, swelling, or pain around your incision.  You have more fluid or blood coming from your incision.  Your incision feels warm to the touch.  You have pus or a bad smell coming from your incision. Get help right away if:  You have severe pain or bleeding.  You cannot eat or drink without vomiting.  You have decreased urine output.  You become short of breath.  You have chest pain.  You cough up blood.  The area where the incision and drainage occurred becomes numb or it tingles. This information is not intended to replace advice given to you by your health care provider. Make sure you discuss any questions you have with your health care provider. Document Released: 09/11/2011 Document Revised: 11/19/2015 Document Reviewed: 04/09/2015 Elsevier Interactive Patient Education  2017 Elsevier Inc.  

## 2017-01-04 NOTE — Assessment & Plan Note (Signed)
Much improved; re-packed; will continue Augmentin.

## 2017-01-04 NOTE — Progress Notes (Signed)
Kayla [name] 36 y.o.   Chief Complaint  Patient presents with  . Wound Care    abscess under the arm.  . Follow-up  . Allergic Reaction    per pt feels she is having a allergic reaction to Germ Shield antimicrobial wound gel, she states that she has a mild itch and rash on arms.    HISTORY OF PRESENT ILLNESS: This is a 36 y.o. female here for recheck of left axillary abscess s/p I&D, 12/27/16. Doing better but has developed a mild rash to the arms most likely due to Germ Shield antimicrobial.  HPI   Prior to Admission medications   Medication Sig Start Date End Date Taking? Authorizing Provider  amoxicillin-clavulanate (AUGMENTIN) 875-125 MG tablet Take 1 tablet by mouth 2 (two) times daily. 01/01/17 01/08/17  Georgina QuintSagardia, Terilynn Buresh Jose, MD  Cholecalciferol (VITAMIN D) 2000 units CAPS Take 1 capsule by mouth daily.    [provider]  doxycycline (VIBRA-TABS) 100 MG tablet Take 1 tablet (100 mg total) by mouth 2 (two) times daily. 12/27/16   Georgina QuintSagardia, Mariela Rex Jose, MD  mupirocin ointment (BACTROBAN) 2 % Apply 1 application topically 2 (two) times daily. 12/18/16   Georgina QuintSagardia, Enyah Moman Jose, MD  pramoxine-hydrocortisone [facility](ANALPRAM HC) cream Apply topically 3 (three) times daily.    [provider]  tobramycin (TOBREX) 0.3 % ophthalmic solution Place 2 drops into the left eye every 4 (four) hours. 12/18/16   Georgina QuintSagardia, Sai Moura Jose, MD  traMADol (ULTRAM) 50 MG tablet Take 1 tablet (50 mg total) by mouth every 8 (eight) hours as needed. 12/27/16   Georgina QuintSagardia, Jakari Sada Jose, MD    Allergies  Allergen Reactions  . Bactrim Nausea And Vomiting and Other (See Comments)    Overall makes her feel "horrible"  Body aches n/v, HA    Patient Active Problem List   Diagnosis Date Noted  . Abscess of axilla, left 12/27/2016  . Cellulitis of axilla, left 12/27/2016  . Vaginal itching 12/27/2016  . Cellulitis of finger of left hand 12/18/2016  . Acute bacterial conjunctivitis of left eye  12/18/2016  . Pregnancy 04/27/2014  . Advanced maternal age (AMA) in pregnancy 02/13/2014  . Obesity in pregnancy, antepartum 02/13/2014    Past Medical History:  Diagnosis Date  . Abnormal Pap smear   . Headache(784.0)   . History of chlamydia   . History of hemorrhoids   . Morbidly obese (HCC)   . Pre-diabetes   . Varicose vulva in pregnancy     Past Surgical History:  Procedure Laterality Date  . CESAREAN SECTION N/A 04/27/2014   Procedure: CESAREAN SECTION;  Surgeon: Meriel Picaichard M Holland, MD;  Location: [facility];  Service: Obstetrics;  Laterality: N/A;  . CHOLECYSTECTOMY  2007  . COLONOSCOPY WITH PROPOFOL N/A 02/10/2016   Procedure: COLONOSCOPY WITH PROPOFOL;  Surgeon: Rachael Feeaniel P Jacobs, MD;  Location: [facility];  Service: Endoscopy;  Laterality: N/A;  . OVARIAN CYST DRAINAGE  2003    Social History   Social History  . Marital status: Married    Spouse name: N/A  . Number of children: 2  . Years of education: N/A   Occupational History  . human resources    Social History Main Topics  . Smoking status: Never Smoker  . Smokeless tobacco: Never Used  . Alcohol use No  . Drug use: No  . Sexual activity: Yes    Birth control/ protection: None   Other Topics Concern  . Not on file   Social History Narrative  .  No narrative on file    Family History  Problem Relation Age of Onset  . Cardiomyopathy Father   . Heart failure Father   . Diabetes Father   . Hypertension Father   . Heart attack Father   . Hypertension Paternal Uncle   . Cancer Paternal Uncle         type unknown  . Breast cancer Maternal Grandmother   . Anesthesia problems Neg Hx   . Hypotension Neg Hx   . Malignant hyperthermia Neg Hx   . Pseudochol deficiency Neg Hx      Review of Systems  Constitutional: Negative for chills and fever.  Gastrointestinal: Negative for diarrhea, nausea and vomiting.  Skin: Positive for rash (arms).  All other systems reviewed and are negative.  Vitals:     01/04/17 1623  BP: 130/84  Pulse: 81  Resp: 18  Temp: 98.5 F (36.9 C)     Physical Exam  Constitutional: She is oriented to person, place, and time. She appears well-developed and well-nourished.  HENT:  Head: Normocephalic and atraumatic.  Eyes: EOM are normal. Pupils are equal, round, and reactive to light.  Neck: Normal range of motion. Neck supple.  Cardiovascular: Normal rate.   Pulmonary/Chest: Effort normal.  Musculoskeletal: Normal range of motion.  Neurological: She is alert and oriented to person, place, and time.  Skin: Skin is warm and dry. Capillary refill takes less than 2 seconds.  Left axillary area: much improved, packing removed, still draining, less induration and less erythema. Re-packed. Arms: faint maculo-papular rash to forearms.  Psychiatric: She has a normal mood and affect. Her behavior is normal.  Vitals reviewed.  Abscess of axilla, left Much improved; re-packed; will continue Augmentin.  Cellulitis of axilla, left Much improved.  Rash Most likely due to Germ Shield cream. Will start Prednisone x 5 days.    ASSESSMENT & PLAN: Kimbely was seen today for wound care, follow-up and allergic reaction.  Diagnoses and all orders for this visit:  Abscess of axilla, left  Cellulitis of axilla, left  Rash Comments: arms  Other orders -     predniSONE (DELTASONE) 20 MG tablet; Take 2 tablets (40 mg total) by mouth daily with breakfast.    Patient Instructions       IF you received an x-ray today, you will receive an invoice from [facility]. Please contact [facility] at [phone] with questions or concerns regarding your invoice.   IF you received labwork today, you will receive an invoice from [facility]. Please contact [facility] at [phone] with questions or concerns regarding your invoice.   Our billing staff will not be able to assist you with questions regarding bills from these companies.  You will  be contacted with the lab results as soon as they are available. The fastest way to get your results is to activate your My Chart account. Instructions are located on the last page of this paperwork. If you have not heard from [place] regarding the results in 2 weeks, please contact this office.     Incision and Drainage, Care After Refer to this sheet in the next few weeks. These instructions provide you with information about caring for yourself after your procedure. Your health care provider may also give you more specific instructions. Your treatment has been planned according to current medical practices, but problems sometimes occur. Call your health care provider if you have any problems or questions after your procedure. What can I expect after the procedure? After the  procedure, it is common to have:  Pain or discomfort around your incision site.  Drainage from your incision.  Follow these instructions at home:  Take over-the-counter and prescription medicines only as told by your health care provider.  If you were prescribed an antibiotic medicine, take it as told by your health care provider.Do not stop taking the antibiotic even if you start to feel better.  Followinstructions from your health care provider about: ? How to take care of your incision. ? When and how you should change your packing and bandage (dressing). Wash your hands with soap and water before you change your dressing. If soap and water are not available, use hand sanitizer. ? When you should remove your dressing.  Do not take baths, swim, or use a hot tub until your health care provider approves.  Keep all follow-up visits as told by your health care provider. This is important.  Check your incision area every day for signs of infection. Check for: ? More redness, swelling, or pain. ? More fluid or blood. ? Warmth. ? Pus or a bad smell. Contact a health care provider if:  Your cyst or abscess  returns.  You have a fever.  You have more redness, swelling, or pain around your incision.  You have more fluid or blood coming from your incision.  Your incision feels warm to the touch.  You have pus or a bad smell coming from your incision. Get help right away if:  You have severe pain or bleeding.  You cannot eat or drink without vomiting.  You have decreased urine output.  You become short of breath.  You have chest pain.  You cough up blood.  The area where the incision and drainage occurred becomes numb or it tingles. This information is not intended to replace advice given to you by your health care provider. Make sure you discuss any questions you have with your health care provider. Document Released: 09/11/2011 Document Revised: 11/19/2015 Document Reviewed: 04/09/2015 Elsevier Interactive Patient Education  2017 Elsevier Inc.      Edwina Barth, MD [facility]

## 2017-01-08 ENCOUNTER — Ambulatory Visit (INDEPENDENT_AMBULATORY_CARE_PROVIDER_SITE_OTHER): Payer: 59 | Admitting: Emergency Medicine

## 2017-01-08 ENCOUNTER — Encounter: Payer: Self-pay | Admitting: Emergency Medicine

## 2017-01-08 VITALS — BP 120/74 | HR 65 | Temp 98.7°F | Resp 16 | Ht 64.5 in | Wt 361.4 lb

## 2017-01-08 DIAGNOSIS — L02412 Cutaneous abscess of left axilla: Secondary | ICD-10-CM

## 2017-01-08 NOTE — Patient Instructions (Addendum)
pc    IF you received an x-ray today, you [facility] an [facility]. [facility] at [phone] with questions or concerns regarding your invoice.   IF you received labwork today, you will receive an invoice from [facility]. Please contact [facility] at [phone] with questions or concerns regarding your invoice.   Our billing staff will not be able to assist you with questions regarding bills from these companies.  You will be contacted with the lab results as soon as they are available. The fastest way to get your results is to activate your My Chart account. Instructions are located on the last page of this paperwork. If you have not heard from us regarding the results in 2 weeks, please contact this office.     Wound Care, Adult Taking care of your wound properly can help to prevent pain and infection. It can also help your wound to heal more quickly. How is this treated? Wound care  Follow instructions from your health care provider about how to take care of your wound. Make sure you: ? Wash your hands with soap and water before you change the bandage (dressing). If soap and water are not available, use hand sanitizer. ? Change your dressing as told by your health care provider. ? Leave stitches (sutures), skin glue, or adhesive strips in place. These skin closures may need to stay in place for 2 weeks or longer. If adhesive strip edges start to loosen and curl up, you may trim the loose edges. Do not remove adhesive strips completely unless your health care provider tells you to do that.  Check your wound area every day for signs of infection. Check for: ? More redness, swelling, or pain. ? More fluid or blood. ? Warmth. ? Pus or a bad smell.  Ask your health care provider if you should clean the wound with mild soap and water. Doing this may include: ? Using a clean towel to pat the wound dry after cleaning it. Do not rub or scrub  the wound. ? Applying a cream or ointment. Do this only as told by your health care provider. ? Covering the incision with a clean dressing.  Ask your health care provider when you can leave the wound uncovered. Medicines   If you were prescribed an antibiotic medicine, cream, or ointment, take or use the antibiotic as told by your health care provider. Do not stop taking or using the antibiotic even if your condition improves.  Take over-the-counter and prescription medicines only as told by your health care provider. If you were prescribed pain medicine, take it at least 30 minutes before doing any wound care or as told by your health care provider. General instructions  Return to your normal activities as told by your health care provider. Ask your health care provider what activities are safe.  Do not scratch or pick at the wound.  Keep all follow-up visits as told by your health care provider. This is important.  Eat a diet that includes protein, vitamin A, vitamin C, and other nutrient-rich foods. These help the wound heal: ? Protein-rich foods include meat, dairy, beans, nuts, and other sources. ? Vitamin A-rich foods include carrots and dark green, leafy vegetables. ? Vitamin C-rich foods include citrus, tomatoes, and other fruits and vegetables. ? Nutrient-rich foods have protein, carbohydrates, fat, vitamins, or minerals. Eat a variety of healthy foods including vegetables, fruits, and whole grains. Contact a health care provider if:  You received  a tetanus shot and you have swelling, severe pain, redness, or bleeding at the injection site.  Your pain is not controlled with medicine.  You have more redness, swelling, or pain around the wound.  You have more fluid or blood coming from the wound.  Your wound feels warm to the touch.  You have pus or a bad smell coming from the wound.  You have a fever or chills.  You are nauseous or you vomit.  You are dizzy. Get  help right away if:  You have a red streak going away from your wound.  The edges of the wound open up and separate.  Your wound is bleeding and the bleeding does not stop with gentle pressure.  You have a rash.  You faint.  You have trouble breathing. This information is not intended to replace advice given to you by your health care provider. Make sure you discuss any questions you have with your health care provider. Document Released: 03/28/2008 Document Revised: 02/16/2016 Document Reviewed: 01/04/2016 Elsevier Interactive Patient Education  2017 [employer].

## 2017-01-08 NOTE — Progress Notes (Signed)
Kayla [name] 36 y.o.   Chief Complaint  Patient presents with  . Follow-up    LEFT UNDER ARM    HISTORY OF PRESENT ILLNESS: This is a 36 y.o. female here for follow up of left axillary abscess; doing better; no complaints.  HPI   Prior to Admission medications   Medication Sig Start Date End Date Taking? Authorizing Provider  amoxicillin-clavulanate (AUGMENTIN) 875-125 MG tablet Take 1 tablet by mouth 2 (two) times daily. 01/01/17 01/08/17  Georgina QuintSagardia, Deontez Klinke Jose, MD  Cholecalciferol (VITAMIN D) 2000 units CAPS Take 1 capsule by mouth daily.    [provider]  doxycycline (VIBRA-TABS) 100 MG tablet Take 1 tablet (100 mg total) by mouth 2 (two) times daily. Patient not taking: Reported on 01/04/2017 12/27/16   Georgina QuintSagardia, Eadie Repetto Jose, MD  mupirocin ointment (BACTROBAN) 2 % Apply 1 application topically 2 (two) times daily. 12/18/16   Georgina QuintSagardia, Luretta Everly Jose, MD  pramoxine-hydrocortisone [facility](ANALPRAM [facility]) cream Apply topically 3 (three) times daily.    [provider]  predniSONE (DELTASONE) 20 MG tablet Take 2 tablets (40 mg total) by mouth daily with breakfast. 01/04/17 01/09/17  Georgina QuintSagardia, Sandy Blouch Jose, MD  tobramycin (TOBREX) 0.3 % ophthalmic solution Place 2 drops into the left eye every 4 (four) hours. 12/18/16   Georgina QuintSagardia, Yarlin Breisch Jose, MD  traMADol (ULTRAM) 50 MG tablet Take 1 tablet (50 mg total) by mouth every 8 (eight) hours as needed. 12/27/16   Georgina QuintSagardia, Keoni Havey Jose, MD    Allergies  Allergen Reactions  . Bactrim Nausea And Vomiting and Other (See Comments)    Overall makes her feel "horrible"  Body aches n/v, HA    Patient Active Problem List   Diagnosis Date Noted  . Rash 01/04/2017  . Abscess of axilla, left 12/27/2016  . Cellulitis of axilla, left 12/27/2016  . Vaginal itching 12/27/2016  . Cellulitis of finger of left hand 12/18/2016  . Acute bacterial conjunctivitis of left eye 12/18/2016  . Pregnancy 04/27/2014  . Advanced maternal age (AMA) in pregnancy  02/13/2014  . Obesity in pregnancy, antepartum 02/13/2014    Past Medical History:  Diagnosis Date  . Abnormal Pap smear   . Headache(784.0)   . History of chlamydia   . History of hemorrhoids   . Morbidly obese (HCC)   . Pre-diabetes   . Varicose vulva in pregnancy     Past Surgical History:  Procedure Laterality Date  . CESAREAN SECTION N/A 04/27/2014   Procedure: CESAREAN SECTION;  Surgeon: Meriel Picaichard M Holland, MD;  Location: [facility];  Service: Obstetrics;  Laterality: N/A;  . CHOLECYSTECTOMY  2007  . COLONOSCOPY WITH PROPOFOL N/A 02/10/2016   Procedure: COLONOSCOPY WITH PROPOFOL;  Surgeon: Rachael Feeaniel P Jacobs, MD;  Location: [facility];  Service: Endoscopy;  Laterality: N/A;  . OVARIAN CYST DRAINAGE  2003    Social History   Social History  . Marital status: Married    Spouse name: N/A  . Number of children: 2  . Years of education: N/A   Occupational History  . human resources    Social History Main Topics  . Smoking status: Never Smoker  . Smokeless tobacco: Never Used  . Alcohol use No  . Drug use: No  . Sexual activity: Yes    Birth control/ protection: None   Other Topics Concern  . Not on file   Social History Narrative  . No narrative on file    Family History  Problem Relation Age of Onset  . Cardiomyopathy Father   .  Heart failure Father   . Diabetes Father   . Hypertension Father   . Heart attack Father   . Hypertension Paternal Uncle   . Cancer Paternal Uncle         type unknown  . Breast cancer Maternal Grandmother   . Anesthesia problems Neg Hx   . Hypotension Neg Hx   . Malignant hyperthermia Neg Hx   . Pseudochol deficiency Neg Hx      Review of Systems  Constitutional: Negative for chills and fever.  Gastrointestinal: Negative for abdominal pain, diarrhea, nausea and vomiting.  All other systems reviewed and are negative.  Vitals:   01/08/17 1620  BP: 120/74  Pulse: 65  Resp: 16  Temp: 98.7 F (37.1 C)    Physical Exam    Constitutional: She is oriented to person, place, and time. She appears well-developed.  HENT:  Head: Normocephalic and atraumatic.  Eyes: Pupils are equal, round, and reactive to light.  Neck: Normal range of motion.  Cardiovascular: Normal rate.   Pulmonary/Chest: Effort normal.  Musculoskeletal:  Left axilla: much improved; no erythema or induration; mild residual swelling; no packing present (fell out at home).  Neurological: She is alert and oriented to person, place, and time.  Skin: Capillary refill takes less than 2 seconds.  Psychiatric: She has a normal mood and affect. Her behavior is normal.  Vitals reviewed.    ASSESSMENT & PLAN: Kayla [name] was seen today for follow-up.  Diagnoses and all orders for this visit:  Abscess of axilla, left Comments: much improved    Patient Instructions   pc    IF you received an x-ray today, you will receive an invoice from [facility]. Please contact [facility] at [phone] with questions or concerns regarding your invoice.   IF you received labwork today, you will receive an invoice from [place]. Please contact [facility] at [phone] with questions or concerns regarding your invoice.   Our billing staff will not be able to assist you with questions regarding bills from these companies.  You will be contacted with the lab results as soon as they are available. The fastest way to get your results is to activate your My Chart account. Instructions are located on the last page of this paperwork. If you have not heard from [place] regarding the results in 2 weeks, please contact this office.     Wound Care, Adult Taking care of your wound properly can help to prevent pain and infection. It can also help your wound to heal more quickly. How is this treated? Wound care  Follow instructions from your health care provider about how to take care of your wound. Make sure you: ? Wash your hands with soap and  water before you change the bandage (dressing). If soap and water are not available, use hand sanitizer. ? Change your dressing as told by your health care provider. ? Leave stitches (sutures), skin glue, or adhesive strips in place. These skin closures may need to stay in place for 2 weeks or longer. If adhesive strip edges start to loosen and curl up, you may trim the loose edges. Do not remove adhesive strips completely unless your health care provider tells you to do that.  Check your wound area every day for signs of infection. Check for: ? More redness, swelling, or pain. ? More fluid or blood. ? Warmth. ? Pus or a bad smell.  Ask your health care provider if you should clean the wound with mild soap  and water. Doing this may include: ? Using a clean towel to pat the wound dry after cleaning it. Do not rub or scrub the wound. ? Applying a cream or ointment. Do this only as told by your health care provider. ? Covering the incision with a clean dressing.  Ask your health care provider when you can leave the wound uncovered. Medicines   If you were prescribed an antibiotic medicine, cream, or ointment, take or use the antibiotic as told by your health care provider. Do not stop taking or using the antibiotic even if your condition improves.  Take over-the-counter and prescription medicines only as told by your health care provider. If you were prescribed pain medicine, take it at least 30 minutes before doing any wound care or as told by your health care provider. General instructions  Return to your normal activities as told by your health care provider. Ask your health care provider what activities are safe.  Do not scratch or pick at the wound.  Keep all follow-up visits as told by your health care provider. This is important.  Eat a diet that includes protein, vitamin A, vitamin C, and other nutrient-rich foods. These help the wound heal: ? Protein-rich foods include meat,  dairy, beans, nuts, and other sources. ? Vitamin A-rich foods include carrots and dark green, leafy vegetables. ? Vitamin C-rich foods include citrus, tomatoes, and other fruits and vegetables. ? Nutrient-rich foods have protein, carbohydrates, fat, vitamins, or minerals. Eat a variety of healthy foods including vegetables, fruits, and whole grains. Contact a health care provider if:  You received a tetanus shot and you have swelling, severe pain, redness, or bleeding at the injection site.  Your pain is not controlled with medicine.  You have more redness, swelling, or pain around the wound.  You have more fluid or blood coming from the wound.  Your wound feels warm to the touch.  You have pus or a bad smell coming from the wound.  You have a fever or chills.  You are nauseous or you vomit.  You are dizzy. Get help right away if:  You have a red streak going away from your wound.  The edges of the wound open up and separate.  Your wound is bleeding and the bleeding does not stop with gentle pressure.  You have a rash.  You faint.  You have trouble breathing. This information is not intended to replace advice given to you by your health care provider. Make sure you discuss any questions you have with your health care provider. Document Released: 03/28/2008 Document Revised: 02/16/2016 Document Reviewed: 01/04/2016 Elsevier Interactive Patient Education  2017 Elsevier Inc.      Edwina Barth, MD [facility]

## 2017-01-15 ENCOUNTER — Encounter: Payer: Self-pay | Admitting: Emergency Medicine

## 2017-01-15 ENCOUNTER — Telehealth: Payer: Self-pay | Admitting: General Practice

## 2017-01-17 ENCOUNTER — Other Ambulatory Visit: Payer: Self-pay | Admitting: Emergency Medicine

## 2017-01-17 MED ORDER — PREDNISONE 20 MG PO TABS: 40.0000 mg | ORAL_TABLET | Freq: Every day | ORAL | 0 refills | Status: AC

## 2017-01-18 ENCOUNTER — Encounter: Payer: Self-pay | Admitting: Emergency Medicine

## 2017-01-22 ENCOUNTER — Telehealth: Payer: Self-pay | Admitting: Emergency Medicine

## 2017-01-22 NOTE — Telephone Encounter (Signed)
Patient needs FMLA forms completed for her job for her staph infection on her hand, face, and arm that she seen Dr Alvy BimlerSagardia for.. I have completed what I could from the OV notes and highlighted the parts I was not sure of. I will place the forms in your box on 01/22/17 please return them to the FMLA/Disability box at the 102 checkout desk within 5-7 business days. Thank you!

## 2017-01-23 NOTE — Telephone Encounter (Signed)
Paperwork scanned and faxed on 01/23/17 °

## 2017-01-24 DIAGNOSIS — Z0271 Encounter for disability determination: Secondary | ICD-10-CM

## 2017-02-02 ENCOUNTER — Other Ambulatory Visit: Payer: Self-pay | Admitting: Emergency Medicine

## 2017-02-02 ENCOUNTER — Encounter: Payer: Self-pay | Admitting: Emergency Medicine

## 2017-02-02 DIAGNOSIS — B0089 Other herpesviral infection: Secondary | ICD-10-CM

## 2017-02-02 MED ORDER — METHYLPREDNISOLONE 4 MG PO TBPK: ORAL_TABLET | ORAL | 1 refills | Status: DC

## 2017-02-02 MED ORDER — ACYCLOVIR 400 MG PO TABS: 400.0000 mg | ORAL_TABLET | Freq: Four times a day (QID) | ORAL | 0 refills | Status: AC

## 2017-02-02 NOTE — Telephone Encounter (Signed)
You need to keep it covered

## 2017-03-03 ENCOUNTER — Encounter: Payer: Self-pay | Admitting: Emergency Medicine

## 2017-03-06 ENCOUNTER — Ambulatory Visit: Payer: 59 | Admitting: Emergency Medicine

## 2017-06-18 ENCOUNTER — Other Ambulatory Visit: Payer: Self-pay

## 2017-06-18 ENCOUNTER — Encounter: Payer: Self-pay | Admitting: Physician Assistant

## 2017-06-18 ENCOUNTER — Ambulatory Visit: Payer: 59 | Admitting: Physician Assistant

## 2017-06-18 VITALS — BP 134/72 | HR 95 | Temp 98.1°F | Resp 20 | Ht 65.16 in | Wt 373.8 lb

## 2017-06-18 DIAGNOSIS — N926 Irregular menstruation, unspecified: Secondary | ICD-10-CM

## 2017-06-18 DIAGNOSIS — Z3201 Encounter for pregnancy test, result positive: Secondary | ICD-10-CM

## 2017-06-18 LAB — POCT URINE PREGNANCY: PREG TEST UR: POSITIVE — AB

## 2017-06-18 NOTE — Progress Notes (Signed)
   Kayla [name]  MRN: [id number] DOB: 1980/11/12  Subjective:  Kayla [name] is a 36 y.o. female seen in office today for a chief complaint of need of pregnancy test. LMP 05/08/17.  Her stool cycle is typically regular.  She completed an at home pregnancy test and it was positive so wanted to come in and get checked. Has had some breast tenderness, nuasea, and fatigue. She has two children, ages 143 and 836. Does not have an establshed gynecologist. No other questions or concerns.   Review of Systems  Constitutional: Negative for chills and diaphoresis.  Gastrointestinal: Negative for abdominal pain and vomiting.    Patient Active Problem List   Diagnosis Date Noted  . Rash 01/04/2017  . Abscess of axilla, left 12/27/2016  . Cellulitis of axilla, left 12/27/2016  . Vaginal itching 12/27/2016  . Cellulitis of finger of left hand 12/18/2016  . Acute bacterial conjunctivitis of left eye 12/18/2016  . Pregnancy 04/27/2014  . Advanced maternal age (AMA) in pregnancy 02/13/2014  . Obesity in pregnancy, antepartum 02/13/2014    Current Outpatient Medications on File Prior to Visit  Medication Sig Dispense Refill  . Cholecalciferol (VITAMIN D) 2000 units CAPS Take 1 capsule by mouth daily.     No current facility-administered medications on file prior to visit.     Allergies  Allergen Reactions  . Bactrim Nausea And Vomiting and Other (See Comments)    Overall makes her feel "horrible"  Body aches n/v, HA     Objective:  BP 134/72 (BP Location: Right Arm, Patient Position: Sitting, Cuff Size: Large)   Pulse 95   Temp 98.1 F (36.7 C) (Oral)   Resp 20   Ht 5' 5.16" (1.655 m)   Wt (!) 373 lb 12.8 oz (169.6 kg)   LMP 05/08/2017 (Exact Date)   SpO2 100%   Breastfeeding? No   BMI 61.90 kg/m   Physical Exam  Constitutional: She is oriented to person, place, and time and well-developed, well-nourished, and in no distress.  HENT:  Head: Normocephalic and atraumatic.    Eyes: Conjunctivae are normal.  Neck: Normal range of motion.  Pulmonary/Chest: Effort normal.  Neurological: She is alert and oriented to person, place, and time. Gait normal.  Skin: Skin is warm and dry.  Psychiatric: Affect normal.  Vitals reviewed.    Results for orders placed or performed in visit on 06/18/17 (from the past 24 hour(s))  POCT urine pregnancy     Status: Abnormal   Collection Time: 06/18/17  4:08 PM  Result Value Ref Range   Preg Test, Ur Positive (A) Negative    Assessment and Plan :  1. Missed period - POCT urine pregnancy 2. Positive pregnancy test Point-of-care pregnancy test positive.  Patient excited about pregnancy.  Given educational material on pregnancy.  She has a few OB/gynecologists in town that she is interested in seeing.  Encouraged her to contact their office as soon as possible to schedule appointment.  Benjiman CoreBrittany Wiseman PA-C  [facility] at [facility]  [facility] 06/18/2017 6:16 PM

## 2017-06-18 NOTE — Patient Instructions (Addendum)
Your urine pregnancy test was positive today! Congratulations! Please call and schedule with an OB/gyn as soon as possible.    If your first day of your last menstrual cycle was 05/08/17, your:  Estimated Due Date is 02/12/2018  Gestational Age Today is 5 week(s), 6 day(s) or 1.35 month(s) months.  Date of Conception 05/22/2017   First Trimester of Pregnancy The first trimester of pregnancy is from week 1 until the end of week 13 (months 1 through 3). During this time, your baby will begin to develop inside you. At 6-8 weeks, the eyes and face are formed, and the heartbeat can be seen on ultrasound. At the end of 12 weeks, all the baby's organs are formed. Prenatal care is all the medical care you receive before the birth of your baby. Make sure you get good prenatal care and follow all of your doctor's instructions. Follow these instructions at home: Medicines  Take over-the-counter and prescription medicines only as told by your doctor. Some medicines are safe and some medicines are not safe during pregnancy.  Take a prenatal vitamin that contains at least 600 micrograms (mcg) of folic acid.  If you have trouble pooping (constipation), take medicine that will make your stool soft (stool softener) if your doctor approves. Eating and drinking  Eat regular, healthy meals.  Your doctor will tell you the amount of weight gain that is right for you.  Avoid raw meat and uncooked cheese.  If you feel sick to your stomach (nauseous) or throw up (vomit): ? Eat 4 or 5 small meals a day instead of 3 large meals. ? Try eating a few soda crackers. ? Drink liquids between meals instead of during meals.  To prevent constipation: ? Eat foods that are high in fiber, like fresh fruits and vegetables, whole grains, and beans. ? Drink enough fluids to keep your pee (urine) clear or pale yellow. Activity  Exercise only as told by your doctor. Stop exercising if you have cramps or pain in your lower  belly (abdomen) or low back.  Do not exercise if it is too hot, too humid, or if you are in a place of great height (high altitude).  Try to avoid standing for long periods of time. Move your legs often if you must stand in one place for a long time.  Avoid heavy lifting.  Wear low-heeled shoes. Sit and stand up straight.  You can have sex unless your doctor tells you not to. Relieving pain and discomfort  Wear a good support bra if your breasts are sore.  Take warm water baths (sitz baths) to soothe pain or discomfort caused by hemorrhoids. Use hemorrhoid cream if your doctor says it is okay.  Rest with your legs raised if you have leg cramps or low back pain.  If you have puffy, bulging veins (varicose veins) in your legs: ? Wear support hose or compression stockings as told by your doctor. ? Raise (elevate) your feet for 15 minutes, 3-4 times a day. ? Limit salt in your food. Prenatal care  Schedule your prenatal visits by the twelfth week of pregnancy.  Write down your questions. Take them to your prenatal visits.  Keep all your prenatal visits as told by your doctor. This is important. Safety  Wear your seat belt at all times when driving.  Make a list of emergency phone numbers. The list should include numbers for family, friends, the hospital, and police and fire departments. General instructions  Ask your doctor  for a referral to a local prenatal class. Begin classes no later than at the start of month 6 of your pregnancy.  Ask for help if you need counseling or if you need help with nutrition. Your doctor can give you advice or tell you where to go for help.  Do not use hot tubs, steam rooms, or saunas.  Do not douche or use tampons or scented sanitary pads.  Do not cross your legs for long periods of time.  Avoid all herbs and alcohol. Avoid drugs that are not approved by your doctor.  Do not use any tobacco products, including cigarettes, chewing tobacco,  and electronic cigarettes. If you need help quitting, ask your doctor. You may get counseling or other support to help you quit.  Avoid cat litter boxes and soil used by cats. These carry germs that can cause birth defects in the baby and can cause a loss of your baby (miscarriage) or stillbirth.  Visit your dentist. At home, brush your teeth with a soft toothbrush. Be gentle when you floss. Contact a doctor if:  You are dizzy.  You have mild cramps or pressure in your lower belly.  You have a nagging pain in your belly area.  You continue to feel sick to your stomach, you throw up, or you have watery poop (diarrhea).  You have a bad smelling fluid coming from your vagina.  You have pain when you pee (urinate).  You have increased puffiness (swelling) in your face, hands, legs, or ankles. Get help right away if:  You have a fever.  You are leaking fluid from your vagina.  You have spotting or bleeding from your vagina.  You have very bad belly cramping or pain.  You gain or lose weight rapidly.  You throw up blood. It may look like coffee grounds.  You are around people who have [place] measles, fifth disease, or chickenpox.  You have a very bad headache.  You have shortness of breath.  You have any kind of trauma, such as from a fall or a car accident. Summary  The first trimester of pregnancy is from week 1 until the end of week 13 (months 1 through 3).  To take care of yourself and your unborn baby, you will need to eat healthy meals, take medicines only if your doctor tells you to do so, and do activities that are safe for you and your baby.  Keep all follow-up visits as told by your doctor. This is important as your doctor will have to ensure that your baby is healthy and growing well. This information is not intended to replace advice given to you by your health care provider. Make sure you discuss any questions you have with your health care provider. Document  Released: 12/06/2007 Document Revised: 06/27/2016 Document Reviewed: 06/27/2016 Elsevier Interactive Patient Education  2017 [employer]   IF you received an x-ray today, you will receive an invoice from [facility]. Please contact [facility] at [phone] with questions or concerns regarding your invoice.   IF you received labwork today, you will receive an invoice from [facility]. Please contact [facility] at [phone] with questions or concerns regarding your invoice.   Our billing staff will not be able to assist you with questions regarding bills from these companies.  You will be contacted with the lab results as soon as they are available. The fastest way to get your results is to activate your My Chart account. Instructions are located on the  last page of this paperwork. If you have not heard from [place] regarding the results in 2 weeks, please contact this office.

## 2017-07-09 ENCOUNTER — Inpatient Hospital Stay (HOSPITAL_COMMUNITY)
Admission: AD | Admit: 2017-07-09 | Discharge: 2017-07-09 | Disposition: A | Discharge status: Home / Self Care | Payer: 59 | Source: Ambulatory Visit | Attending: Obstetrics and Gynecology | Admitting: Obstetrics and Gynecology

## 2017-07-09 ENCOUNTER — Other Ambulatory Visit: Payer: Self-pay

## 2017-07-09 ENCOUNTER — Encounter (HOSPITAL_COMMUNITY): Payer: Self-pay | Admitting: *Deleted

## 2017-07-09 ENCOUNTER — Inpatient Hospital Stay (HOSPITAL_COMMUNITY): Payer: 59

## 2017-07-09 DIAGNOSIS — Z3A01 Less than 8 weeks gestation of pregnancy: Secondary | ICD-10-CM | POA: Diagnosis not present

## 2017-07-09 DIAGNOSIS — Z803 Family history of malignant neoplasm of breast: Secondary | ICD-10-CM | POA: Diagnosis not present

## 2017-07-09 DIAGNOSIS — O209 Hemorrhage in early pregnancy, unspecified: Secondary | ICD-10-CM | POA: Diagnosis not present

## 2017-07-09 DIAGNOSIS — O26851 Spotting complicating pregnancy, first trimester: Secondary | ICD-10-CM | POA: Diagnosis not present

## 2017-07-09 DIAGNOSIS — R109 Unspecified abdominal pain: Secondary | ICD-10-CM | POA: Diagnosis not present

## 2017-07-09 DIAGNOSIS — Z833 Family history of diabetes mellitus: Secondary | ICD-10-CM | POA: Diagnosis not present

## 2017-07-09 DIAGNOSIS — Z79899 Other long term (current) drug therapy: Secondary | ICD-10-CM | POA: Diagnosis not present

## 2017-07-09 DIAGNOSIS — M549 Dorsalgia, unspecified: Secondary | ICD-10-CM | POA: Insufficient documentation

## 2017-07-09 DIAGNOSIS — O26859 Spotting complicating pregnancy, unspecified trimester: Secondary | ICD-10-CM

## 2017-07-09 DIAGNOSIS — Z8249 Family history of ischemic heart disease and other diseases of the circulatory system: Secondary | ICD-10-CM | POA: Diagnosis not present

## 2017-07-09 DIAGNOSIS — O99211 Obesity complicating pregnancy, first trimester: Secondary | ICD-10-CM | POA: Diagnosis not present

## 2017-07-09 DIAGNOSIS — O34219 Maternal care for unspecified type scar from previous cesarean delivery: Secondary | ICD-10-CM | POA: Insufficient documentation

## 2017-07-09 DIAGNOSIS — Z9889 Other specified postprocedural states: Secondary | ICD-10-CM | POA: Insufficient documentation

## 2017-07-09 DIAGNOSIS — O9989 Other specified diseases and conditions complicating pregnancy, childbirth and the puerperium: Secondary | ICD-10-CM | POA: Diagnosis present

## 2017-07-09 DIAGNOSIS — Z888 Allergy status to other drugs, medicaments and biological substances status: Secondary | ICD-10-CM | POA: Insufficient documentation

## 2017-07-09 LAB — WET PREP, GENITAL
Clue Cells Wet Prep HPF POC: NONE SEEN
SPERM: NONE SEEN
Trich, Wet Prep: NONE SEEN
YEAST WET PREP: NONE SEEN

## 2017-07-09 LAB — CBC
HCT: 37.5 % (ref 36.0–46.0)
Hemoglobin: 12.4 g/dL (ref 12.0–15.0)
MCH: 30.4 pg (ref 26.0–34.0)
MCHC: 33.1 g/dL (ref 30.0–36.0)
MCV: 91.9 fL (ref 78.0–100.0)
Platelets: 317 10*3/uL (ref 150–400)
RBC: 4.08 MIL/uL (ref 3.87–5.11)
RDW: 13.1 % (ref 11.5–15.5)
WBC: 5.8 10*3/uL (ref 4.0–10.5)

## 2017-07-09 LAB — URINALYSIS, ROUTINE W REFLEX MICROSCOPIC
BILIRUBIN URINE: NEGATIVE
GLUCOSE, UA: NEGATIVE mg/dL
KETONES UR: NEGATIVE mg/dL
NITRITE: NEGATIVE
PH: 5 (ref 5.0–8.0)
PROTEIN: NEGATIVE mg/dL
Specific Gravity, Urine: 1.012 (ref 1.005–1.030)

## 2017-07-09 LAB — HCG, QUANTITATIVE, PREGNANCY: HCG, BETA CHAIN, QUANT, S: 9670 m[IU]/mL — AB (ref <5)

## 2017-07-09 MED ORDER — PRENATAL VITAMINS 0.8 MG PO TABS: 1.0000 | ORAL_TABLET | Freq: Every day | ORAL | 12 refills | Status: DC

## 2017-07-09 NOTE — MAU Provider Note (Signed)
History     CSN: [id number]  Arrival date and time: 07/09/17 [id number]   First Provider Initiated Contact with Patient 07/09/17 [phone]      Chief Complaint  Patient presents with  . Vaginal Bleeding  . Abdominal Pain  . Back Pain   [id number] @[redacted]w[redacted]d  by sure LMP here with spotting and cramping. Spotting started 3 days ago. Pink color, scant amt. Cramping started last night, feels like menstrual cramps. Sharp pains at times, bilateral lower quadrants. Did not take anything for it. Rates 5/10.   RN Note: Since Friday, has had some pink spotting, sometimes some clottish looking d/c, seen 3 times.  Last night started having cramping like menstrual cramps, discomfort.    OB History    Gravida Para Term Preterm AB Living   3 2 2  0 0 2   SAB TAB Ectopic Multiple Live Births   0 0 0 0 2      Past Medical History:  Diagnosis Date  . Abnormal Pap smear   . Headache(784.0)   . History of chlamydia   . History of hemorrhoids   . Morbidly obese (HCC)   . Pre-diabetes   . Varicose vulva in pregnancy     Past Surgical History:  Procedure Laterality Date  . CESAREAN SECTION N/A 04/27/2014   Procedure: CESAREAN SECTION;  Surgeon: Meriel Picaichard M Holland, MD;  Location: [facility];  Service: Obstetrics;  Laterality: N/A;  . CHOLECYSTECTOMY  2007  . COLONOSCOPY WITH PROPOFOL N/A 02/10/2016   Procedure: COLONOSCOPY WITH PROPOFOL;  Surgeon: Rachael Feeaniel P Jacobs, MD;  Location: [facility];  Service: Endoscopy;  Laterality: N/A;  . OVARIAN CYST DRAINAGE  2003    Family History  Problem Relation Age of Onset  . Cardiomyopathy Father   . Heart failure Father   . Diabetes Father   . Hypertension Father   . Heart attack Father   . Hypertension Paternal Uncle   . Cancer Paternal Uncle         type unknown  . Breast cancer Maternal Grandmother   . Anesthesia problems Neg Hx   . Hypotension Neg Hx   . Malignant hyperthermia Neg Hx   . Pseudochol deficiency Neg Hx     Social History   Tobacco Use  .  Smoking status: Never Smoker  . Smokeless tobacco: Never Used  Substance Use Topics  . Alcohol use: No  . Drug use: No    Allergies:  Allergies  Allergen Reactions  . Bactrim Nausea And Vomiting and Other (See Comments)    Overall makes her feel "horrible"  Body aches n/v, HA    Medications Prior to Admission  Medication Sig Dispense Refill Last Dose  . Cholecalciferol (VITAMIN D) 2000 units CAPS Take 1 capsule by mouth daily.   Taking    Review of Systems  Constitutional: Negative for fever.  Gastrointestinal: Positive for abdominal pain. Negative for constipation, diarrhea, nausea and vomiting.  Genitourinary: Positive for vaginal bleeding.  Musculoskeletal: Positive for back pain.   Physical Exam   Blood pressure 109/65, pulse 65, temperature 98.4 F (36.9 C), temperature source Oral, resp. rate 18, weight (!) 368 lb 4 oz (167 kg), last menstrual period 05/08/2017, SpO2 100 %.  Physical Exam  Nursing note and vitals reviewed. Constitutional: She is oriented to person, place, and time. She appears well-developed and well-nourished. No distress.  HENT:  Head: Normocephalic and atraumatic.  Neck: Normal range of motion.  Cardiovascular: Normal rate.  Respiratory: Effort normal. No respiratory distress.  GI: Soft. She exhibits no distension and no mass. There is no tenderness. There is no rebound and no guarding.  Genitourinary:  Genitourinary Comments: External: no lesions or erythema Vagina: rugated, pink, moist, small pink bloody discharge Uterus/Adnexae: difficult d/t body habitus, no CMT  Musculoskeletal: Normal range of motion.  Neurological: She is alert and oriented to person, place, and time.  Skin: Skin is warm and dry.  Psychiatric: She has a normal mood and affect.   Results for orders placed or performed during the hospital encounter of 07/09/17 (from the past 24 hour(s))  Urinalysis, Routine w reflex microscopic     Status: Abnormal   Collection Time:  07/09/17  7:00 AM  Result Value Ref Range   Color, Urine YELLOW YELLOW   APPearance HAZY (A) CLEAR   Specific Gravity, Urine 1.012 1.005 - 1.030   pH 5.0 5.0 - 8.0   Glucose, UA NEGATIVE NEGATIVE mg/dL   Hgb urine dipstick LARGE (A) NEGATIVE   Bilirubin Urine NEGATIVE NEGATIVE   Ketones, ur NEGATIVE NEGATIVE mg/dL   Protein, ur NEGATIVE NEGATIVE mg/dL   Nitrite NEGATIVE NEGATIVE   Leukocytes, UA SMALL (A) NEGATIVE   RBC / HPF 6-30 0 - 5 RBC/hpf   WBC, UA 6-30 0 - 5 WBC/hpf   Bacteria, UA RARE (A) NONE SEEN   Squamous Epithelial / LPF 6-30 (A) NONE SEEN   Mucus PRESENT   Wet prep, genital     Status: Abnormal   Collection Time: 07/09/17  7:22 AM  Result Value Ref Range   Yeast Wet Prep HPF POC NONE SEEN NONE SEEN   Trich, Wet Prep NONE SEEN NONE SEEN   Clue Cells Wet Prep HPF POC NONE SEEN NONE SEEN   WBC, Wet Prep HPF POC MANY (A) NONE SEEN   Sperm NONE SEEN   CBC     Status: None   Collection Time: 07/09/17  7:27 AM  Result Value Ref Range   WBC 5.8 4.0 - 10.5 K/uL   RBC 4.08 3.87 - 5.11 MIL/uL   Hemoglobin 12.4 12.0 - 15.0 g/dL   HCT 16.1 09.6 - 04.5 %   MCV 91.9 78.0 - 100.0 fL   MCH 30.4 26.0 - 34.0 pg   MCHC 33.1 30.0 - 36.0 g/dL   RDW 40.9 81.1 - 91.4 %   Platelets 317 150 - 400 K/uL   MAU Course  Procedures  MDM Labs and [place] ordered. Transfer of care given to M.Mayford Knife CNM Donette Larry, CNM  07/09/2017 8:15 AM    Assessment and Plan   Results for orders placed or performed during the hospital encounter of 07/09/17 (from the past 24 hour(s))  Urinalysis, Routine w reflex microscopic     Status: Abnormal   Collection Time: 07/09/17  7:00 AM  Result Value Ref Range   Color, Urine YELLOW YELLOW   APPearance HAZY (A) CLEAR   Specific Gravity, Urine 1.012 1.005 - 1.030   pH 5.0 5.0 - 8.0   Glucose, UA NEGATIVE NEGATIVE mg/dL   Hgb urine dipstick LARGE (A) NEGATIVE   Bilirubin Urine NEGATIVE NEGATIVE   Ketones, ur NEGATIVE NEGATIVE mg/dL   Protein, ur  NEGATIVE NEGATIVE mg/dL   Nitrite NEGATIVE NEGATIVE   Leukocytes, UA SMALL (A) NEGATIVE   RBC / HPF 6-30 0 - 5 RBC/hpf   WBC, UA 6-30 0 - 5 WBC/hpf   Bacteria, UA RARE (A) NONE SEEN   Squamous Epithelial / LPF 6-30 (A) NONE SEEN   Mucus PRESENT  Wet prep, genital     Status: Abnormal   Collection Time: 07/09/17  7:22 AM  Result Value Ref Range   Yeast Wet Prep HPF POC NONE SEEN NONE SEEN   Trich, Wet Prep NONE SEEN NONE SEEN   Clue Cells Wet Prep HPF POC NONE SEEN NONE SEEN   WBC, Wet Prep HPF POC MANY (A) NONE SEEN   Sperm NONE SEEN   CBC     Status: None   Collection Time: 07/09/17  7:27 AM  Result Value Ref Range   WBC 5.8 4.0 - 10.5 K/uL   RBC 4.08 3.87 - 5.11 MIL/uL   Hemoglobin 12.4 12.0 - 15.0 g/dL   HCT 40.9 81.1 - 91.4 %   MCV 91.9 78.0 - 100.0 fL   MCH 30.4 26.0 - 34.0 pg   MCHC 33.1 30.0 - 36.0 g/dL   RDW 78.2 95.6 - 21.3 %   Platelets 317 150 - 400 K/uL  hCG, quantitative, pregnancy     Status: Abnormal   Collection Time: 07/09/17  7:27 AM  Result Value Ref Range   hCG, Beta Chain, Quant, S 9,670 (H) <5 mIU/mL   US Ob Less Than 14 Weeks With Ob Transvaginal  Result Date: 07/09/2017 CLINICAL DATA:  Spotting for 3 days EXAM: OBSTETRIC <14 WK [place] AND TRANSVAGINAL OB US TECHNIQUE: Both transabdominal and transvaginal ultrasound examinations were performed for complete evaluation of the gestation as well as the maternal uterus, adnexal regions, and pelvic cul-de-sac. Transvaginal technique was performed to assess early pregnancy. COMPARISON:  None. FINDINGS: Intrauterine gestational sac: Single Yolk sac:  Visualized Embryo:  Visualized Cardiac Activity: Not visualized Heart Rate:   bpm MSD:   mm    w     d CRL:  3.4  mm   5 w   6 d                  [place] EDC: 03/05/2018 Subchorionic hemorrhage:  Small subchorionic hemorrhage Maternal uterus/adnexae: No adnexal mass or free fluid. IMPRESSION: Five week 6 day intrauterine pregnancy. No fetal cardiac activity documented currently.  This could be followed with repeat ultrasound in 10-14 days to ensure expected progression. Small subchorionic hemorrhage. Electronically Signed   By: Charlett Nose M.D.   On: 07/09/2017 08:34   Single IUP at [redacted]w[redacted]d Small Subchorionic hemorrhage  Discussed findings Discussed that US findings of fetal pole and yolk sac effectively rules out ectopic pregnancy Will likely need an [place] in a few weeks for viability. Has a call in to try to get an appointment at [facility], but does not have one yet  Discharge home Pelvic rest Bleeding precautions Aviva Signs, CNM

## 2017-07-09 NOTE — Discharge Instructions (Signed)
Vaginal Bleeding During Pregnancy, First Trimester  A small amount of bleeding (spotting) from the vagina is common in early pregnancy. Sometimes the bleeding is normal and is not a problem, and sometimes it is a sign of something serious. Be sure to tell your doctor about any bleeding from your vagina right away.  Follow these instructions at home:  · Watch your condition for any changes.  · Follow your doctor's instructions about how active you can be.  · If you are on bed rest:  ? You may need to stay in bed and only get up to use the bathroom.  ? You may be allowed to do some activities.  ? If you need help, make plans for someone to help you.  · Write down:  ? The number of pads you use each day.  ? How often you change pads.  ? How soaked (saturated) your pads are.  · Do not use tampons.  · Do not douche.  · Do not have sex or orgasms until your doctor says it is okay.  · If you pass any tissue from your vagina, save the tissue so you can show it to your doctor.  · Only take medicines as told by your doctor.  · Do not take aspirin because it can make you bleed.  · Keep all follow-up visits as told by your doctor.  Contact a doctor if:  · You bleed from your vagina.  · You have cramps.  · You have labor pains.  · You have a fever that does not go away after you take medicine.  Get help right away if:  · You have very bad cramps in your back or belly (abdomen).  · You pass large clots or tissue from your vagina.  · You bleed more.  · You feel light-headed or weak.  · You pass out (faint).  · You have chills.  · You are leaking fluid or have a gush of fluid from your vagina.  · You pass out while pooping (having a bowel movement).  This information is not intended to replace advice given to you by your health care provider. Make sure you discuss any questions you have with your health care provider.  Document Released: 11/03/2013 Document Revised: 11/25/2015 Document Reviewed: 02/24/2013  Elsevier Interactive  Patient Education © 2018 Elsevier Inc.    First Trimester of Pregnancy  The first trimester of pregnancy is from week 1 until the end of week 13 (months 1 through 3). During this time, your baby will begin to develop inside you. At 6-8 weeks, the eyes and face are formed, and the heartbeat can be seen on ultrasound. At the end of 12 weeks, all the baby's organs are formed. Prenatal care is all the medical care you receive before the birth of your baby. Make sure you get good prenatal care and follow all of your doctor's instructions.  Follow these instructions at home:  Medicines  · Take over-the-counter and prescription medicines only as told by your doctor. Some medicines are safe and some medicines are not safe during pregnancy.  · Take a prenatal vitamin that contains at least 600 micrograms (mcg) of folic acid.  · If you have trouble pooping (constipation), take medicine that will make your stool soft (stool softener) if your doctor approves.  Eating and drinking  · Eat regular, healthy meals.  · Your doctor will tell you the amount of weight gain that is right for you.  ·   Avoid raw meat and uncooked cheese.  · If you feel sick to your stomach (nauseous) or throw up (vomit):  ? Eat 4 or 5 small meals a day instead of 3 large meals.  ? Try eating a few soda crackers.  ? Drink liquids between meals instead of during meals.  · To prevent constipation:  ? Eat foods that are high in fiber, like fresh fruits and vegetables, whole grains, and beans.  ? Drink enough fluids to keep your pee (urine) clear or pale yellow.  Activity  · Exercise only as told by your doctor. Stop exercising if you have cramps or pain in your lower belly (abdomen) or low back.  · Do not exercise if it is too hot, too humid, or if you are in a place of great height (high altitude).  · Try to avoid standing for long periods of time. Move your legs often if you must stand in one place for a long time.  · Avoid heavy lifting.  · Wear low-heeled  shoes. Sit and stand up straight.  · You can have sex unless your doctor tells you not to.  Relieving pain and discomfort  · Wear a good support bra if your breasts are sore.  · Take warm water baths (sitz baths) to soothe pain or discomfort caused by hemorrhoids. Use hemorrhoid cream if your doctor says it is okay.  · Rest with your legs raised if you have leg cramps or low back pain.  · If you have puffy, bulging veins (varicose veins) in your legs:  ? Wear support hose or compression stockings as told by your doctor.  ? Raise (elevate) your feet for 15 minutes, 3-4 times a day.  ? Limit salt in your food.  Prenatal care  · Schedule your prenatal visits by the twelfth week of pregnancy.  · Write down your questions. Take them to your prenatal visits.  · Keep all your prenatal visits as told by your doctor. This is important.  Safety  · Wear your seat belt at all times when driving.  · Make a list of emergency phone numbers. The list should include numbers for family, friends, the hospital, and police and fire departments.  General instructions  · Ask your doctor for a referral to a local prenatal class. Begin classes no later than at the start of month 6 of your pregnancy.  · Ask for help if you need counseling or if you need help with nutrition. Your doctor can give you advice or tell you where to go for help.  · Do not use hot tubs, steam rooms, or saunas.  · Do not douche or use tampons or scented sanitary pads.  · Do not cross your legs for long periods of time.  · Avoid all herbs and alcohol. Avoid drugs that are not approved by your doctor.  · Do not use any tobacco products, including cigarettes, chewing tobacco, and electronic cigarettes. If you need help quitting, ask your doctor. You may get counseling or other support to help you quit.  · Avoid cat litter boxes and soil used by cats. These carry germs that can cause birth defects in the baby and can cause a loss of your baby (miscarriage) or  stillbirth.  · Visit your dentist. At home, brush your teeth with a soft toothbrush. Be gentle when you floss.  Contact a doctor if:  · You are dizzy.  · You have mild cramps or pressure in your lower belly.  ·   You have a nagging pain in your belly area.  · You continue to feel sick to your stomach, you throw up, or you have watery poop (diarrhea).  · You have a bad smelling fluid coming from your vagina.  · You have pain when you pee (urinate).  · You have increased puffiness (swelling) in your face, hands, legs, or ankles.  Get help right away if:  · You have a fever.  · You are leaking fluid from your vagina.  · You have spotting or bleeding from your vagina.  · You have very bad belly cramping or pain.  · You gain or lose weight rapidly.  · You throw up blood. It may look like coffee grounds.  · You are around people who have German measles, fifth disease, or chickenpox.  · You have a very bad headache.  · You have shortness of breath.  · You have any kind of trauma, such as from a fall or a car accident.  Summary  · The first trimester of pregnancy is from week 1 until the end of week 13 (months 1 through 3).  · To take care of yourself and your unborn baby, you will need to eat healthy meals, take medicines only if your doctor tells you to do so, and do activities that are safe for you and your baby.  · Keep all follow-up visits as told by your doctor. This is important as your doctor will have to ensure that your baby is healthy and growing well.  This information is not intended to replace advice given to you by your health care provider. Make sure you discuss any questions you have with your health care provider.  Document Released: 12/06/2007 Document Revised: 06/27/2016 Document Reviewed: 06/27/2016  Elsevier Interactive Patient Education © 2017 Elsevier Inc.

## 2017-07-09 NOTE — MAU Note (Signed)
Since Friday, has had some pink spotting, sometimes some clottish looking d/c, seen 3 times.  Last night started having cramping like menstrual cramps, discomfort.

## 2017-07-10 ENCOUNTER — Other Ambulatory Visit: Payer: Self-pay | Admitting: Obstetrics and Gynecology

## 2017-07-10 LAB — GC/CHLAMYDIA PROBE AMP (~~LOC~~) NOT AT ARMC
Chlamydia: NEGATIVE
Neisseria Gonorrhea: NEGATIVE

## 2017-08-07 ENCOUNTER — Ambulatory Visit: Payer: 59 | Admitting: Emergency Medicine

## 2017-08-08 ENCOUNTER — Other Ambulatory Visit: Payer: Self-pay

## 2017-08-08 ENCOUNTER — Ambulatory Visit: Payer: 59 | Admitting: Emergency Medicine

## 2017-08-08 ENCOUNTER — Encounter: Payer: Self-pay | Admitting: Emergency Medicine

## 2017-08-08 VITALS — BP 104/80 | HR 97 | Temp 98.6°F | Resp 16 | Ht >= 80 in | Wt 366.8 lb

## 2017-08-08 DIAGNOSIS — M7918 Myalgia, other site: Secondary | ICD-10-CM | POA: Diagnosis not present

## 2017-08-08 DIAGNOSIS — M62838 Other muscle spasm: Secondary | ICD-10-CM | POA: Diagnosis not present

## 2017-08-08 DIAGNOSIS — M545 Low back pain, unspecified: Secondary | ICD-10-CM

## 2017-08-08 LAB — POCT URINALYSIS DIP (MANUAL ENTRY)
BILIRUBIN UA: NEGATIVE
GLUCOSE UA: NEGATIVE mg/dL
Leukocytes, UA: NEGATIVE
NITRITE UA: NEGATIVE
Protein Ur, POC: 100 mg/dL — AB
RBC UA: NEGATIVE
Spec Grav, UA: >=1.03 — AB (ref 1.010–1.025)
Urobilinogen, UA: 0.2 E.U./dL
pH, UA: 6 (ref 5.0–8.0)

## 2017-08-08 MED ORDER — CYCLOBENZAPRINE HCL 10 MG PO TABS: 10.0000 mg | ORAL_TABLET | Freq: Three times a day (TID) | ORAL | 0 refills | Status: DC | PRN

## 2017-08-08 MED ORDER — DICLOFENAC SODIUM 75 MG PO TBEC: 75.0000 mg | DELAYED_RELEASE_TABLET | Freq: Two times a day (BID) | ORAL | 0 refills | Status: AC

## 2017-08-08 NOTE — Patient Instructions (Addendum)
IF you received an x-ray today, you will receive an invoice from [facility] Radiology. Please contact [facility] at [phone] with questions or concerns regarding your invoice.   IF you received labwork today, you will receive an invoice from [place]. Please contact [facility] at [phone] with questions or concerns regarding your invoice.   Our billing staff will not be able to assist you with questions regarding bills from these companies.  You will be contacted with the lab results as soon as they are available. The fastest way to get your results is to activate your My Chart account. Instructions are located on the last page of this paperwork. If you have not heard from [place] regarding the results in 2 weeks, please contact this office.        IF you received an x-ray today, you will receive an invoice from [facility]. Please contact [facility] Radiology at [phone] with questions or concerns regarding your invoice.   IF you received labwork today, you will receive an invoice from [place]. Please contact [facility] at [phone] with questions or concerns regarding your invoice.   Our billing staff will not be able to assist you with questions regarding bills from these companies.  You will be contacted with the lab results as soon as they are available. The fastest way to get your results is to activate your My Chart account. Instructions are located on the last page of this paperwork. If you have not heard from [place] regarding the results in 2 weeks, please contact this office.     Back Pain, Adult Back pain is very common. The pain often gets better over time. The cause of back pain is usually not dangerous. Most people can learn to manage their back pain on their own. Follow these instructions at home: Watch your back pain for any changes. The following actions may help to lessen any pain you are feeling:  Stay active. Start with short walks on flat  ground if you can. Try to walk farther each day.  Exercise regularly as told by your doctor. Exercise helps your back heal faster. It also helps avoid future injury by keeping your muscles strong and flexible.  Do not sit, drive, or stand in one place for more than 30 minutes.  Do not stay in bed. Resting more than 1-2 days can slow down your recovery.  Be careful when you bend or lift an object. Use good form when lifting: ? Bend at your knees. ? Keep the object close to your body. ? Do not twist.  Sleep on a firm mattress. Lie on your side, and bend your knees. If you lie on your back, put a pillow under your knees.  Take medicines only as told by your doctor.  Put ice on the injured area. ? Put ice in a plastic bag. ? Place a towel between your skin and the bag. ? Leave the ice on for 20 minutes, 2-3 times a day for the first 2-3 days. After that, you can switch between ice and heat packs.  Avoid feeling anxious or stressed. Find good ways to deal with stress, such as exercise.  Maintain a healthy weight. Extra weight puts stress on your back.  Contact a doctor if:  You have pain that does not go away with rest or medicine.  You have worsening pain that goes down into your legs or buttocks.  You have pain that does not get better in one week.  You have  pain at night.  You lose weight.  You have a fever or chills. Get help right away if:  You cannot control when you poop (bowel movement) or pee (urinate).  Your arms or legs feel weak.  Your arms or legs lose feeling (numbness).  You feel sick to your stomach (nauseous) or throw up (vomit).  You have belly (abdominal) pain.  You feel like you may pass out (faint). This information is not intended to replace advice given to you by your health care provider. Make sure you discuss any questions you have with your health care provider. Document Released: 12/06/2007 Document Revised: 11/25/2015 Document Reviewed:  10/21/2013 Elsevier Interactive Patient Education  [employer]2018 [employer] Inc.

## 2017-08-08 NOTE — Progress Notes (Signed)
Kayla [name] 37 y.o.   Chief Complaint  Patient presents with  . Back Pain    lower to RIGHT side x 6 days    HISTORY OF PRESENT ILLNESS: [name] complaining of right sided low back pain that started 5 days ago.  Back Pain  This is a new problem. The current episode started in the past 7 days. The problem has been waxing and waning since onset. The pain is present in the lumbar spine. The pain is at a severity of 7/10. The pain is moderate. The pain is the same all the time. The symptoms are aggravated by bending and position. Pertinent negatives include no abdominal pain, bladder incontinence, bowel incontinence, chest pain, dysuria, fever, headaches, leg pain, numbness, paresis, paresthesias, pelvic pain, perianal numbness, tingling, weakness or weight loss. Risk factors: none.     Prior to Admission medications   Medication Sig Start Date End Date Taking? Authorizing Provider  Cholecalciferol (VITAMIN D) 2000 units CAPS Take 1 capsule by mouth daily.   Yes [provider]  Prenatal Multivit-Min-Fe-FA (PRENATAL VITAMINS) 0.8 MG tablet Take 1 tablet by mouth daily. 07/09/17  Yes Aviva Signs, CNM    Allergies  Allergen Reactions  . Bactrim Nausea And Vomiting and Other (See Comments)    Overall makes her feel "horrible"  Body aches n/v, HA    Patient Active Problem List   Diagnosis Date Noted  . Rash 01/04/2017  . Abscess of axilla, left 12/27/2016  . Cellulitis of axilla, left 12/27/2016  . Vaginal itching 12/27/2016  . Cellulitis of finger of left hand 12/18/2016  . Acute bacterial conjunctivitis of left eye 12/18/2016  . Pregnancy 04/27/2014  . Advanced maternal age (AMA) in pregnancy 02/13/2014  . Obesity in pregnancy, antepartum 02/13/2014    Past Medical History:  Diagnosis Date  . Abnormal Pap smear   . Headache(784.0)   . History of chlamydia   . History of hemorrhoids   . Morbidly obese (HCC)   . Pre-diabetes   .  Varicose vulva in pregnancy     Past Surgical History:  Procedure Laterality Date  . CESAREAN SECTION N/A 04/27/2014   Procedure: CESAREAN SECTION;  Surgeon: Meriel Pica, MD;  Location: [facility];  Service: Obstetrics;  Laterality: N/A;  . CHOLECYSTECTOMY  2007  . COLONOSCOPY WITH PROPOFOL N/A 02/10/2016   Procedure: COLONOSCOPY WITH PROPOFOL;  Surgeon: Rachael Fee, MD;  Location: [facility];  Service: Endoscopy;  Laterality: N/A;  . OVARIAN CYST DRAINAGE  2003    Social History   Socioeconomic History  . Marital status: Married    Spouse name: Marcy Salvo  . Number of children: 2  . Years of education: Not on file  . Highest education level: Not on file  Social Needs  . Financial resource strain: Not on file  . Food insecurity - worry: Not on file  . Food insecurity - inability: Not on file  . Transportation needs - medical: Not on file  . Transportation needs - non-medical: Not on file  Occupational History  . Occupation: human resources  Tobacco Use  . Smoking status: Never Smoker  . Smokeless tobacco: Never Used  Substance and Sexual Activity  . Alcohol use: No  . Drug use: No  . Sexual activity: Yes    Birth control/protection: None  Other Topics Concern  . Not on file  Social History Narrative  . Not on file    Family History  Problem Relation  Age of Onset  . Cardiomyopathy Father   . Heart failure Father   . Diabetes Father   . Hypertension Father   . Heart attack Father   . Hypertension Paternal Uncle   . Cancer Paternal Uncle         type unknown  . Breast cancer Maternal Grandmother   . Anesthesia problems Neg Hx   . Hypotension Neg Hx   . Malignant hyperthermia Neg Hx   . Pseudochol deficiency Neg Hx      Review of Systems  Constitutional: Negative for fever and weight loss.  HENT: Negative.   Eyes: Negative.   Respiratory: Negative.  Negative for cough and shortness of breath.   Cardiovascular: Negative.  Negative for chest pain and  palpitations.  Gastrointestinal: Negative for abdominal pain, blood in stool, bowel incontinence, diarrhea, melena, nausea and vomiting.  Genitourinary: Negative for bladder incontinence, dysuria, frequency, hematuria, pelvic pain and urgency.  Musculoskeletal: Positive for back pain.  Skin: Negative.  Negative for rash.  Neurological: Negative for dizziness, tingling, weakness, numbness, headaches and paresthesias.  Endo/Heme/Allergies: Negative.   All other systems reviewed and are negative.   Vitals:   08/08/17 1449  BP: 104/80  Pulse: 97  Resp: 16  Temp: 98.6 F (37 C)  SpO2: 97%    Physical Exam  Constitutional: She is oriented to person, place, and time. She appears well-developed.  Obese.  HENT:  Head: Normocephalic and atraumatic.  Nose: Nose normal.  Mouth/Throat: Oropharynx is clear and moist.  Eyes: Conjunctivae and EOM are normal. Pupils are equal, round, and reactive to light.  Neck: Normal range of motion. Neck supple.  Cardiovascular: Normal rate and regular rhythm.  Pulmonary/Chest: Effort normal and breath sounds normal.  Abdominal: Soft. Bowel sounds are normal. There is no tenderness.  Musculoskeletal: Normal range of motion.  Neurological: She is alert and oriented to person, place, and time. No sensory deficit. She exhibits normal muscle tone.  Skin: Skin is warm and dry. Capillary refill takes less than 2 seconds. No rash noted.  Psychiatric: She has a normal mood and affect. Her behavior is normal.  Vitals reviewed.   Results for orders placed or performed in visit on 08/08/17 (from the past 24 hour(s))  POCT urinalysis dipstick     Status: Abnormal   Collection Time: 08/08/17  3:11 PM  Result Value Ref Range   Color, UA yellow yellow   Clarity, UA clear clear   Glucose, UA negative negative mg/dL   Bilirubin, UA negative negative   Ketones, POC UA trace (5) (A) negative mg/dL   Spec Grav, UA >=1.610 (A) 1.010 - 1.025   Blood, UA negative  negative   pH, UA 6.0 5.0 - 8.0   Protein Ur, POC =100 (A) negative mg/dL   Urobilinogen, UA 0.2 0.2 or 1.0 E.U./dL   Nitrite, UA Negative Negative   Leukocytes, UA Negative Negative    ASSESSMENT & PLAN: Kayla [name] was seen today for back pain.  Diagnoses and all orders for this visit:  Low back pain without sciatica, unspecified back pain laterality, unspecified chronicity -     POCT urinalysis dipstick  Muscle spasm -     cyclobenzaprine (FLEXERIL) 10 MG tablet; Take 1 tablet (10 mg total) by mouth 3 (three) times daily as needed for muscle spasms.  Musculoskeletal pain -     diclofenac (VOLTAREN) 75 MG EC tablet; Take 1 tablet (75 mg total) by mouth 2 (two) times daily for 5 days.  Patient Instructions       IF you received an x-ray today, you will receive an invoice from [facility]. Please contact [facility] at [phone] with questions or concerns regarding your invoice.   IF you received labwork today, you will receive an invoice from [facility]. Please contact [facility] at [phone] with questions or concerns regarding your invoice.   Our billing staff will not be able to assist you with questions regarding bills from these companies.  You will be contacted with the lab results as soon as they are available. The fastest way to get your results is to activate your My Chart account. Instructions are located on the last page of this paperwork. If you have not heard from us regarding the results in 2 weeks, please contact this office.        IF you received an x-ray today, you will receive an invoice from [facility]. Please contact [facility] at [phone] with questions or concerns regarding your invoice.   IF you received labwork today, you will receive an invoice from [facility]. Please contact [facility] at [phone] with questions or concerns regarding your invoice.   Our billing staff will not be able to assist  you with questions regarding bills from these companies.  You will be contacted with the lab results as soon as they are available. The fastest way to get your results is to activate your My Chart account. Instructions are located on the last page of this paperwork. If you have not heard from us regarding the results in 2 weeks, please contact this office.     Back Pain, Adult Back pain is very common. The pain often gets better over time. The cause of back pain is usually not dangerous. Most people can learn to manage their back pain on their own. Follow these instructions at home: Watch your back pain for any changes. The following actions may help to lessen any pain you are feeling:  Stay active. Start with short walks on flat ground if you can. Try to walk farther each day.  Exercise regularly as told by your doctor. Exercise helps your back heal faster. It also helps avoid future injury by keeping your muscles strong and flexible.  Do not sit, drive, or stand in one place for more than 30 minutes.  Do not stay in bed. Resting more than 1-2 days can slow down your recovery.  Be careful when you bend or lift an object. Use good form when lifting: ? Bend at your knees. ? Keep the object close to your body. ? Do not twist.  Sleep on a firm mattress. Lie on your side, and bend your knees. If you lie on your back, put a pillow under your knees.  Take medicines only as told by your doctor.  Put ice on the injured area. ? Put ice in a plastic bag. ? Place a towel between your skin and the bag. ? Leave the ice on for 20 minutes, 2-3 times a day for the first 2-3 days. After that, you can switch between ice and heat packs.  Avoid feeling anxious or stressed. Find good ways to deal with stress, such as exercise.  Maintain a healthy weight. Extra weight puts stress on your back.  Contact a doctor if:  You have pain that does not go away with rest or medicine.  You have worsening  pain that goes down into your legs or buttocks.  You have pain that does not get better  in one week.  You have pain at night.  You lose weight.  You have a fever or chills. Get help right away if:  You cannot control when you poop (bowel movement) or pee (urinate).  Your arms or legs feel weak.  Your arms or legs lose feeling (numbness).  You feel sick to your stomach (nauseous) or throw up (vomit).  You have belly (abdominal) pain.  You feel like you may pass out (faint). This information is not intended to replace advice given to you by your health care provider. Make sure you discuss any questions you have with your health care provider. Document Released: 12/06/2007 Document Revised: 11/25/2015 Document Reviewed: 10/21/2013 Elsevier Interactive Patient Education  2018 Elsevier Inc.     Edwina Barth, MD [facility]

## 2017-10-10 ENCOUNTER — Encounter: Payer: Self-pay | Admitting: Physician Assistant

## 2017-10-10 ENCOUNTER — Ambulatory Visit: Payer: 59 | Admitting: Physician Assistant

## 2017-10-10 VITALS — BP 128/85 | HR 83 | Temp 99.3°F | Resp 17 | Ht 66.0 in | Wt 363.0 lb

## 2017-10-10 DIAGNOSIS — B3731 Acute candidiasis of vulva and vagina: Secondary | ICD-10-CM

## 2017-10-10 DIAGNOSIS — R300 Dysuria: Secondary | ICD-10-CM | POA: Diagnosis not present

## 2017-10-10 DIAGNOSIS — N898 Other specified noninflammatory disorders of vagina: Secondary | ICD-10-CM

## 2017-10-10 DIAGNOSIS — B373 Candidiasis of vulva and vagina: Secondary | ICD-10-CM | POA: Diagnosis not present

## 2017-10-10 LAB — POCT WET + KOH PREP: Trich by wet prep: ABSENT

## 2017-10-10 LAB — POCT URINALYSIS DIP (MANUAL ENTRY)
Bilirubin, UA: NEGATIVE
Glucose, UA: NEGATIVE mg/dL
Ketones, POC UA: NEGATIVE mg/dL
Nitrite, UA: NEGATIVE
Protein Ur, POC: NEGATIVE mg/dL
Spec Grav, UA: >=1.03 — AB (ref 1.010–1.025)
Urobilinogen, UA: 0.2 E.U./dL
pH, UA: 5.5 (ref 5.0–8.0)

## 2017-10-10 LAB — POC MICROSCOPIC URINALYSIS (UMFC): Mucus: ABSENT

## 2017-10-10 MED ORDER — FLUCONAZOLE 150 MG PO TABS: 150.0000 mg | ORAL_TABLET | Freq: Once | ORAL | 0 refills | Status: AC

## 2017-10-10 NOTE — Patient Instructions (Addendum)
You are positive for a yeast infection. Start taking diflucan. Come back if you are not improving.  I will call you if your urine culture is positive for bacteria.    Vaginal Yeast infection, Adult Vaginal yeast infection is a condition that causes soreness, swelling, and redness (inflammation) of the vagina. It also causes vaginal discharge. This is a common condition. Some women get this infection frequently. What are the causes? This condition is caused by a change in the normal balance of the yeast (candida) and bacteria that live in the vagina. This change causes an overgrowth of yeast, which causes the inflammation. What increases the risk? This condition is more likely to develop in:  Women who take antibiotic medicines.  Women who have diabetes.  Women who take birth control pills.  Women who are pregnant.  Women who douche often.  Women who have a weak defense (immune) system.  Women who have been taking steroid medicines for a long time.  Women who frequently wear tight clothing.  What are the signs or symptoms? Symptoms of this condition include:  White, thick vaginal discharge.  Swelling, itching, redness, and irritation of the vagina. The lips of the vagina (vulva) may be affected as well.  Pain or a burning feeling while urinating.  Pain during sex.  How is this diagnosed? This condition is diagnosed with a medical history and physical exam. This will include a pelvic exam. Your health care provider will examine a sample of your vaginal discharge under a microscope. Your health care provider may send this sample for testing to confirm the diagnosis. How is this treated? This condition is treated with medicine. Medicines may be over-the-counter or prescription. You may be told to use one or more of the following:  Medicine that is taken orally.  Medicine that is applied as a cream.  Medicine that is inserted directly into the vagina (suppository).  Follow  these instructions at home:  Take or apply over-the-counter and prescription medicines only as told by your health care provider.  Do not have sex until your health care provider has approved. Tell your sex partner that you have a yeast infection. That person should go to his or her health care provider if he or she develops symptoms.  Do not wear tight clothes, such as pantyhose or tight pants.  Avoid using tampons until your health care provider approves.  Eat more yogurt. This may help to keep your yeast infection from returning.  Try taking a sitz bath to help with discomfort. This is a warm water bath that is taken while you are sitting down. The water should only come up to your hips and should cover your buttocks. Do this 3-4 times per day or as told by your health care provider.  Do not douche.  Wear breathable, cotton underwear.  If you have diabetes, keep your blood sugar levels under control. Contact a health care provider if:  You have a fever.  Your symptoms go away and then return.  Your symptoms do not get better with treatment.  Your symptoms get worse.  You have new symptoms.  You develop blisters in or around your vagina.  You have blood coming from your vagina and it is not your menstrual period.  You develop pain in your abdomen. This information is not intended to replace advice given to you by your health care provider. Make sure you discuss any questions you have with your health care provider. Document Released: 03/29/2005 Document Revised:  12/01/2015 Document Reviewed: 12/21/2014 Elsevier Interactive Patient Education  2018 [employer].   IF you received an x-ray today, you will receive an invoice from [facility]. Please contact [facility] at [phone] with questions or concerns regarding your invoice.   IF you received labwork today, you will receive an invoice from [place]. Please contact [facility] at [phone] with  questions or concerns regarding your invoice.   Our billing staff will not be able to assist you with questions regarding bills from these companies.  You will be contacted with the lab results as soon as they are available. The fastest way to get your results is to activate your My Chart account. Instructions are located on the last page of this paperwork. If you have not heard from [place] regarding the results in 2 weeks, please contact this office.

## 2017-10-10 NOTE — Progress Notes (Signed)
Tenise L. Hazan  MRN: [id number] DOB: [birth year]  PCP: Patient, No Pcp Per  Subjective:  Pt is a 37 year old female who presents to clinic for burning with urination x 2 days. Symptoms are not present today.  Also c/o vaginal irritation x 2 days.   Denies vaginal discharge, vaginal pain, low back pain, flank pain, lower abdominal pain, fever, chills.  Not concerned for STDs today.   Review of Systems  Constitutional: Negative for chills, fatigue and fever.  Respiratory: Negative for cough, shortness of breath and wheezing.   Cardiovascular: Negative for chest pain and palpitations.  Gastrointestinal: Negative for abdominal pain, diarrhea, nausea and vomiting.  Genitourinary: Positive for dysuria and vaginal pain ("irritation"). Negative for decreased urine volume, difficulty urinating, enuresis, flank pain, frequency, hematuria and urgency.  Musculoskeletal: Negative for back pain.  Neurological: Negative for dizziness, weakness, light-headedness and headaches.    Patient Active Problem List   Diagnosis Date Noted  . Rash 01/04/2017  . Abscess of axilla, left 12/27/2016  . Cellulitis of axilla, left 12/27/2016  . Vaginal itching 12/27/2016  . Cellulitis of finger of left hand 12/18/2016  . Acute bacterial conjunctivitis of left eye 12/18/2016  . Pregnancy 04/27/2014  . Advanced maternal age (AMA) in pregnancy 02/13/2014  . Obesity in pregnancy, antepartum 02/13/2014    Current Outpatient Medications on File Prior to Visit  Medication Sig Dispense Refill  . Cholecalciferol (VITAMIN D) 2000 units CAPS Take 1 capsule by mouth daily.    . Prenatal Multivit-Min-Fe-FA (PRENATAL VITAMINS) 0.8 MG tablet Take 1 tablet by mouth daily. 30 tablet 12   No current facility-administered medications on file prior to visit.     Allergies  Allergen Reactions  . Bactrim Nausea And Vomiting and Other (See Comments)    Overall makes her feel "horrible"  Body aches n/v, HA       Objective:  BP 128/85   Pulse 83   Temp 99.3 F (37.4 C) (Oral)   Resp 17   Ht 5\' 6"  (1.676 m)   Wt (!) 363 lb (164.7 kg)   LMP 09/18/2017 (Approximate)   SpO2 98%   Breastfeeding? Unknown   BMI 58.59 kg/m   Physical Exam  Constitutional: She appears well-developed and well-nourished.  Abdominal: Soft. Normal appearance and bowel sounds are normal. There is no tenderness. There is no CVA tenderness.  Skin: Skin is warm and dry.  Psychiatric: She has a normal mood and affect. Her behavior is normal. Judgment and thought content normal.    Results for orders placed or performed in visit on 10/10/17  POCT urinalysis dipstick  Result Value Ref Range   Color, UA yellow yellow   Clarity, UA cloudy (A) clear   Glucose, UA negative negative mg/dL   Bilirubin, UA negative negative   Ketones, POC UA negative negative mg/dL   Spec Grav, UA >=4.098 (A) 1.010 - 1.025   Blood, UA trace-lysed (A) negative   pH, UA 5.5 5.0 - 8.0   Protein Ur, POC negative negative mg/dL   Urobilinogen, UA 0.2 0.2 or 1.0 E.U./dL   Nitrite, UA Negative Negative   Leukocytes, UA Small (1+) (A) Negative  POCT Microscopic Urinalysis ([facility])  Result Value Ref Range   WBC,UR,HPF,POC Moderate (A) None WBC/hpf   RBC,UR,HPF,POC None None RBC/hpf   Bacteria Many (A) None, Too numerous to count   Mucus Absent Absent   Epithelial Cells, UR Per Microscopy Few (A) None, Too numerous to count cells/hpf  POCT Wet +  KOH Prep  Result Value Ref Range   Yeast by KOH Present (A) Absent   Yeast by wet prep Present (A) Absent   WBC by wet prep Moderate (A) Few   Clue Cells Wet Prep HPF POC None None   Trich by wet prep Absent Absent   Bacteria Wet Prep HPF POC Many (A) Few   Epithelial Cells By [employer] Financial[employer]et Pref ([facility]) Moderate (A) None, Few, Too numerous to count   RBC,UR,HPF,POC None None RBC/hpf    Assessment and Plan :  1. Vaginal candidiasis - fluconazole (DIFLUCAN) 150 MG tablet; Take 1 tablet (150 mg total) by mouth  once for 1 dose. Repeat if needed  Dispense: 2 tablet; Refill: 0 - pt presents for improving dysuria and vaginal irritation. UA is equivocal for UTI - will await culture. Plan to treat for yeast infection. RTC if symptoms fail to improve.  2. Burning with urination - POCT urinalysis dipstick - POCT Microscopic Urinalysis ([facility]) - Urine Culture  3. Vaginal irritation - POCT Wet + KOH Prep   Marco CollieWhitney Aneliese Beaudry, PA-C  [facility] 10/10/2017 2:46 PM

## 2017-10-11 ENCOUNTER — Encounter: Payer: Self-pay | Admitting: Physician Assistant

## 2017-10-12 ENCOUNTER — Other Ambulatory Visit: Payer: Self-pay | Admitting: Physician Assistant

## 2017-10-12 DIAGNOSIS — Z711 Person with feared health complaint in whom no diagnosis is made: Secondary | ICD-10-CM

## 2017-10-12 DIAGNOSIS — N3001 Acute cystitis with hematuria: Secondary | ICD-10-CM

## 2017-10-12 MED ORDER — NITROFURANTOIN MONOHYD MACRO 100 MG PO CAPS: 100.0000 mg | ORAL_CAPSULE | Freq: Two times a day (BID) | ORAL | 0 refills | Status: DC

## 2017-10-13 ENCOUNTER — Telehealth: Payer: Self-pay | Admitting: *Deleted

## 2017-10-13 LAB — URINE CULTURE

## 2017-10-13 NOTE — Telephone Encounter (Signed)
Lmom for pt to return call to the office today, if she can by 4:00 pm. Pt also, advised if she can't reach me today,  that she may put in a my chart msg to [facility] for the information that I was calling to give her. If this doesn't work for her,she was advised to call me on Monday by 8:00 am.

## 2017-10-13 NOTE — Telephone Encounter (Signed)
LMOM for pt to call the office, and that I'm here today, and if she can reach me by 3:30 pm.

## 2018-04-22 LAB — OB RESULTS CONSOLE GC/CHLAMYDIA
Chlamydia: NEGATIVE
Gonorrhea: NEGATIVE

## 2018-04-22 LAB — OB RESULTS CONSOLE RUBELLA ANTIBODY, IGM: Rubella: IMMUNE

## 2018-04-22 LAB — OB RESULTS CONSOLE ANTIBODY SCREEN: Antibody Screen: NEGATIVE

## 2018-04-22 LAB — OB RESULTS CONSOLE ABO/RH: RH Type: POSITIVE

## 2018-04-22 LAB — OB RESULTS CONSOLE RPR: RPR: NONREACTIVE

## 2018-04-22 LAB — OB RESULTS CONSOLE HEPATITIS B SURFACE ANTIGEN: Hepatitis B Surface Ag: NEGATIVE

## 2018-04-22 LAB — OB RESULTS CONSOLE HIV ANTIBODY (ROUTINE TESTING): HIV: NONREACTIVE

## 2018-07-23 ENCOUNTER — Other Ambulatory Visit (HOSPITAL_COMMUNITY): Payer: Self-pay | Admitting: Obstetrics and Gynecology

## 2018-07-23 DIAGNOSIS — R1010 Upper abdominal pain, unspecified: Secondary | ICD-10-CM

## 2018-07-24 ENCOUNTER — Ambulatory Visit (HOSPITAL_COMMUNITY)
Admission: RE | Admit: 2018-07-24 | Discharge: 2018-07-24 | Disposition: A | Discharge status: Home / Self Care | Payer: 59 | Source: Ambulatory Visit | Attending: Obstetrics and Gynecology | Admitting: Obstetrics and Gynecology

## 2018-07-24 DIAGNOSIS — R1010 Upper abdominal pain, unspecified: Secondary | ICD-10-CM | POA: Diagnosis not present

## 2018-10-24 LAB — OB RESULTS CONSOLE GBS: GBS: POSITIVE

## 2018-10-28 ENCOUNTER — Other Ambulatory Visit: Payer: Self-pay | Admitting: Obstetrics and Gynecology

## 2018-11-07 ENCOUNTER — Other Ambulatory Visit: Payer: Self-pay | Admitting: Obstetrics and Gynecology

## 2018-11-11 ENCOUNTER — Telehealth (HOSPITAL_COMMUNITY): Payer: Self-pay | Admitting: *Deleted

## 2018-11-11 ENCOUNTER — Encounter (HOSPITAL_COMMUNITY): Payer: Self-pay | Admitting: *Deleted

## 2018-11-11 NOTE — Telephone Encounter (Signed)
Preadmission screen  

## 2018-11-18 ENCOUNTER — Telehealth (HOSPITAL_COMMUNITY): Payer: Self-pay | Admitting: *Deleted

## 2018-11-18 NOTE — Telephone Encounter (Signed)
Preadmission screen  

## 2018-11-19 ENCOUNTER — Other Ambulatory Visit (HOSPITAL_COMMUNITY): Payer: 59

## 2018-11-19 ENCOUNTER — Encounter (HOSPITAL_COMMUNITY): Payer: Self-pay

## 2018-11-19 ENCOUNTER — Encounter (HOSPITAL_COMMUNITY)
Admission: RE | Disposition: A | Discharge status: Home / Self Care | Payer: Self-pay | Source: Home / Self Care | Attending: Obstetrics and Gynecology

## 2018-11-19 ENCOUNTER — Inpatient Hospital Stay (HOSPITAL_COMMUNITY): Payer: 59 | Admitting: Anesthesiology

## 2018-11-19 ENCOUNTER — Other Ambulatory Visit: Payer: Self-pay

## 2018-11-19 ENCOUNTER — Inpatient Hospital Stay (HOSPITAL_COMMUNITY)
Admission: RE | Admit: 2018-11-19 | Discharge: 2018-11-22 | DRG: 784 | Disposition: A | Discharge status: Home / Self Care | Payer: 59 | Attending: Obstetrics and Gynecology | Admitting: Obstetrics and Gynecology

## 2018-11-19 DIAGNOSIS — Z302 Encounter for sterilization: Secondary | ICD-10-CM

## 2018-11-19 DIAGNOSIS — Z1159 Encounter for screening for other viral diseases: Secondary | ICD-10-CM | POA: Diagnosis not present

## 2018-11-19 DIAGNOSIS — D62 Acute posthemorrhagic anemia: Secondary | ICD-10-CM | POA: Diagnosis not present

## 2018-11-19 DIAGNOSIS — O34219 Maternal care for unspecified type scar from previous cesarean delivery: Secondary | ICD-10-CM | POA: Diagnosis present

## 2018-11-19 DIAGNOSIS — Z3A38 38 weeks gestation of pregnancy: Secondary | ICD-10-CM | POA: Diagnosis not present

## 2018-11-19 DIAGNOSIS — O9081 Anemia of the puerperium: Secondary | ICD-10-CM | POA: Diagnosis not present

## 2018-11-19 DIAGNOSIS — O99824 Streptococcus B carrier state complicating childbirth: Secondary | ICD-10-CM | POA: Diagnosis present

## 2018-11-19 DIAGNOSIS — O34211 Maternal care for low transverse scar from previous cesarean delivery: Principal | ICD-10-CM | POA: Diagnosis present

## 2018-11-19 DIAGNOSIS — O99214 Obesity complicating childbirth: Secondary | ICD-10-CM | POA: Diagnosis present

## 2018-11-19 LAB — TYPE AND SCREEN
ABO/RH(D): O POS
Antibody Screen: NEGATIVE

## 2018-11-19 LAB — CBC
HCT: 36.3 % (ref 36.0–46.0)
Hemoglobin: 12.4 g/dL (ref 12.0–15.0)
MCH: 31.2 pg (ref 26.0–34.0)
MCHC: 34.2 g/dL (ref 30.0–36.0)
MCV: 91.2 fL (ref 80.0–100.0)
Platelets: 215 10*3/uL (ref 150–400)
RBC: 3.98 MIL/uL (ref 3.87–5.11)
RDW: 13.4 % (ref 11.5–15.5)
WBC: 5.6 10*3/uL (ref 4.0–10.5)
nRBC: 0 % (ref 0.0–0.2)

## 2018-11-19 LAB — SARS CORONAVIRUS 2 BY RT PCR (HOSPITAL ORDER, PERFORMED IN ~~LOC~~ HOSPITAL LAB): SARS Coronavirus 2: NEGATIVE

## 2018-11-19 LAB — ABO/RH: ABO/RH(D): O POS

## 2018-11-19 SURGERY — Surgical Case
Anesthesia: Epidural | Wound class: Clean Contaminated

## 2018-11-19 MED ORDER — OXYCODONE-ACETAMINOPHEN 5-325 MG PO TABS: 1.0000 | ORAL_TABLET | ORAL | Status: DC | PRN

## 2018-11-19 MED ORDER — LACTATED RINGERS IV SOLN: 500.0000 mL | Freq: Once | INTRAVENOUS | Status: AC

## 2018-11-19 MED ORDER — EPHEDRINE 5 MG/ML INJ: 10.0000 mg | INTRAVENOUS | Status: DC | PRN

## 2018-11-19 MED ORDER — SODIUM CHLORIDE (PF) 0.9 % IJ SOLN
INTRAMUSCULAR | Status: DC | PRN
  Administered 2018-11-19: 12 mL/h via EPIDURAL

## 2018-11-19 MED ORDER — PHENYLEPHRINE 40 MCG/ML (10ML) SYRINGE FOR IV PUSH (FOR BLOOD PRESSURE SUPPORT)
80.0000 ug | PREFILLED_SYRINGE | INTRAVENOUS | Status: DC | PRN
  Filled 2018-11-19: qty 10

## 2018-11-19 MED ORDER — FENTANYL CITRATE (PF) 100 MCG/2ML IJ SOLN
100.0000 ug | INTRAMUSCULAR | Status: DC | PRN
  Administered 2018-11-19: 100 ug via INTRAVENOUS

## 2018-11-19 MED ORDER — LACTATED RINGERS AMNIOINFUSION
INTRAVENOUS | Status: DC
  Administered 2018-11-19: 150 mL/h via INTRAUTERINE

## 2018-11-19 MED ORDER — LACTATED RINGERS IV SOLN
INTRAVENOUS | Status: DC
  Administered 2018-11-19 – 2018-11-20 (×3): via INTRAVENOUS

## 2018-11-19 MED ORDER — OXYCODONE-ACETAMINOPHEN 5-325 MG PO TABS: 2.0000 | ORAL_TABLET | ORAL | Status: DC | PRN

## 2018-11-19 MED ORDER — LACTATED RINGERS IV SOLN
500.0000 mL | INTRAVENOUS | Status: DC | PRN
  Administered 2018-11-19: 500 mL via INTRAVENOUS

## 2018-11-19 MED ORDER — FENTANYL-BUPIVACAINE-NACL 0.5-0.125-0.9 MG/250ML-% EP SOLN
12.0000 mL/h | EPIDURAL | Status: DC | PRN
  Filled 2018-11-19: qty 250

## 2018-11-19 MED ORDER — SODIUM CHLORIDE 0.9 % IR SOLN
Status: DC | PRN
  Administered 2018-11-19: 1

## 2018-11-19 MED ORDER — PHENYLEPHRINE HCL (PRESSORS) 10 MG/ML IV SOLN
INTRAVENOUS | Status: DC | PRN
  Administered 2018-11-19 (×6): 80 ug via INTRAVENOUS

## 2018-11-19 MED ORDER — LIDOCAINE HCL (PF) 1 % IJ SOLN: 30.0000 mL | INTRAMUSCULAR | Status: DC | PRN

## 2018-11-19 MED ORDER — FENTANYL CITRATE (PF) 100 MCG/2ML IJ SOLN
INTRAMUSCULAR | Status: AC
  Filled 2018-11-19: qty 2

## 2018-11-19 MED ORDER — OXYTOCIN BOLUS FROM INFUSION: 500.0000 mL | Freq: Once | INTRAVENOUS | Status: DC

## 2018-11-19 MED ORDER — LACTATED RINGERS IV SOLN
INTRAVENOUS | Status: DC
  Administered 2018-11-19: 18:00:00 via INTRAVENOUS

## 2018-11-19 MED ORDER — ONDANSETRON HCL 4 MG/2ML IJ SOLN
4.0000 mg | Freq: Four times a day (QID) | INTRAMUSCULAR | Status: DC | PRN
  Administered 2018-11-19: 4 mg via INTRAVENOUS
  Filled 2018-11-19: qty 2

## 2018-11-19 MED ORDER — LIDOCAINE HCL (PF) 1 % IJ SOLN
INTRAMUSCULAR | Status: DC | PRN
  Administered 2018-11-19: 5 mL via EPIDURAL
  Administered 2018-11-19: 3 mL via EPIDURAL
  Administered 2018-11-19: 2 mL via EPIDURAL

## 2018-11-19 MED ORDER — ACETAMINOPHEN 325 MG PO TABS: 650.0000 mg | ORAL_TABLET | ORAL | Status: DC | PRN

## 2018-11-19 MED ORDER — DIPHENHYDRAMINE HCL 50 MG/ML IJ SOLN: 12.5000 mg | INTRAMUSCULAR | Status: DC | PRN

## 2018-11-19 MED ORDER — SOD CITRATE-CITRIC ACID 500-334 MG/5ML PO SOLN
30.0000 mL | ORAL | Status: DC | PRN
  Administered 2018-11-19: 30 mL via ORAL
  Filled 2018-11-19: qty 30

## 2018-11-19 MED ORDER — PENICILLIN G 3 MILLION UNITS IVPB - SIMPLE MED
3.0000 10*6.[IU] | INTRAVENOUS | Status: DC
  Administered 2018-11-19 (×3): 3 10*6.[IU] via INTRAVENOUS
  Filled 2018-11-19 (×3): qty 100

## 2018-11-19 MED ORDER — DEXTROSE 5 % IV SOLN
INTRAVENOUS | Status: DC | PRN
  Administered 2018-11-19: 3 g via INTRAVENOUS

## 2018-11-19 MED ORDER — EPHEDRINE 5 MG/ML INJ
10.0000 mg | INTRAVENOUS | Status: AC | PRN
  Administered 2018-11-19 (×2): 10 mg via INTRAVENOUS
  Filled 2018-11-19: qty 10

## 2018-11-19 MED ORDER — LACTATED RINGERS IV SOLN
500.0000 mL | Freq: Once | INTRAVENOUS | Status: AC
  Administered 2018-11-19: 700 mL via INTRAVENOUS

## 2018-11-19 MED ORDER — ONDANSETRON HCL 4 MG/2ML IJ SOLN
INTRAMUSCULAR | Status: DC | PRN
  Administered 2018-11-19: 4 mg via INTRAVENOUS

## 2018-11-19 MED ORDER — BUPIVACAINE HCL (PF) 0.25 % IJ SOLN
INTRAMUSCULAR | Status: DC | PRN
  Administered 2018-11-19: 10 mL

## 2018-11-19 MED ORDER — SODIUM CHLORIDE 0.9 % IV SOLN
INTRAVENOUS | Status: DC | PRN
  Administered 2018-11-19: 40 [IU] via INTRAVENOUS

## 2018-11-19 MED ORDER — EPHEDRINE SULFATE 50 MG/ML IJ SOLN
INTRAMUSCULAR | Status: DC | PRN
  Administered 2018-11-19: 10 mg via INTRAVENOUS

## 2018-11-19 MED ORDER — LIDOCAINE-EPINEPHRINE (PF) 2 %-1:200000 IJ SOLN
INTRAMUSCULAR | Status: DC | PRN
  Administered 2018-11-19: 10 mL via EPIDURAL
  Administered 2018-11-20: 4 mL via EPIDURAL

## 2018-11-19 MED ORDER — PHENYLEPHRINE 40 MCG/ML (10ML) SYRINGE FOR IV PUSH (FOR BLOOD PRESSURE SUPPORT): 80.0000 ug | PREFILLED_SYRINGE | INTRAVENOUS | Status: DC | PRN

## 2018-11-19 MED ORDER — SODIUM CHLORIDE 0.9 % IV SOLN
INTRAVENOUS | Status: DC | PRN
  Administered 2018-11-19: 23:00:00 via INTRAVENOUS

## 2018-11-19 MED ORDER — DEXTROSE 5 % IV SOLN
3.0000 g | INTRAVENOUS | Status: DC
  Filled 2018-11-19 (×3): qty 3000

## 2018-11-19 MED ORDER — OXYTOCIN 40 UNITS IN NORMAL SALINE INFUSION - SIMPLE MED
2.5000 [IU]/h | INTRAVENOUS | Status: DC
  Filled 2018-11-19: qty 1000

## 2018-11-19 MED ORDER — SODIUM CHLORIDE 0.9 % IV SOLN
5.0000 10*6.[IU] | Freq: Once | INTRAVENOUS | Status: AC
  Administered 2018-11-19: 5 10*6.[IU] via INTRAVENOUS
  Filled 2018-11-19: qty 5

## 2018-11-19 SURGICAL SUPPLY — 54 items
BARRIER ADHS 3X4 INTERCEED (GAUZE/BANDAGES/DRESSINGS) ×4 IMPLANT
BENZOIN TINCTURE PRP APPL 2/3 (GAUZE/BANDAGES/DRESSINGS) IMPLANT
CHLORAPREP W/TINT 26ML (MISCELLANEOUS) ×4 IMPLANT
CLAMP CORD UMBIL (MISCELLANEOUS) IMPLANT
CLOSURE WOUND 1/2 X4 (GAUZE/BANDAGES/DRESSINGS)
CLOTH BEACON ORANGE TIMEOUT ST (SAFETY) ×4 IMPLANT
DRAPE C SECTION CLR SCREEN (DRAPES) ×4 IMPLANT
DRESSING PREVENA PLUS CUSTOM (GAUZE/BANDAGES/DRESSINGS) IMPLANT
DRSG OPSITE POSTOP 4X10 (GAUZE/BANDAGES/DRESSINGS) ×4 IMPLANT
DRSG PREVENA PLUS CUSTOM (GAUZE/BANDAGES/DRESSINGS) ×4
ELECT REM PT RETURN 9FT ADLT (ELECTROSURGICAL) ×4
ELECTRODE REM PT RTRN 9FT ADLT (ELECTROSURGICAL) ×2 IMPLANT
EXTRACTOR VACUUM M CUP 4 TUBE (SUCTIONS) ×1 IMPLANT
EXTRACTOR VACUUM M CUP 4' TUBE (SUCTIONS) ×1
GLOVE BIOGEL PI IND STRL 7.0 (GLOVE) ×4 IMPLANT
GLOVE BIOGEL PI INDICATOR 7.0 (GLOVE) ×4
GLOVE ECLIPSE 6.5 STRL STRAW (GLOVE) ×4 IMPLANT
GOWN STRL REUS W/TWL LRG LVL3 (GOWN DISPOSABLE) ×8 IMPLANT
KIT ABG SYR 3ML LUER SLIP (SYRINGE) IMPLANT
NDL HYPO 25X5/8 SAFETYGLIDE (NEEDLE) IMPLANT
NEEDLE HYPO 22GX1.5 SAFETY (NEEDLE) ×4 IMPLANT
NEEDLE HYPO 25X5/8 SAFETYGLIDE (NEEDLE) IMPLANT
NS IRRIG 1000ML POUR BTL (IV SOLUTION) ×4 IMPLANT
PACK C SECTION WH (CUSTOM PROCEDURE TRAY) ×4 IMPLANT
PAD OB MATERNITY 4.3X12.25 (PERSONAL CARE ITEMS) ×4 IMPLANT
RETRACTOR TRAXI PANNICULUS (MISCELLANEOUS) IMPLANT
RTRCTR C-SECT PINK 25CM LRG (MISCELLANEOUS) IMPLANT
SPONGE LAP 18X18 X RAY DECT (DISPOSABLE) ×6 IMPLANT
STAPLER VISISTAT 35W (STAPLE) ×2 IMPLANT
STRIP CLOSURE SKIN 1/2X4 (GAUZE/BANDAGES/DRESSINGS) IMPLANT
SUT CHROMIC GUT AB #0 18 (SUTURE) IMPLANT
SUT MNCRL 0 VIOLET CTX 36 (SUTURE) ×6 IMPLANT
SUT MON AB 2-0 SH 27 (SUTURE)
SUT MON AB 2-0 SH27 (SUTURE) IMPLANT
SUT MON AB 3-0 SH 27 (SUTURE)
SUT MON AB 3-0 SH27 (SUTURE) IMPLANT
SUT MON AB 4-0 PS1 27 (SUTURE) IMPLANT
SUT MONOCRYL 0 CTX 36 (SUTURE) ×8
SUT PLAIN 2 0 (SUTURE)
SUT PLAIN 2 0 XLH (SUTURE) ×4 IMPLANT
SUT PLAIN ABS 2-0 CT1 27XMFL (SUTURE) IMPLANT
SUT VIC AB 0 CT1 27 (SUTURE) ×2
SUT VIC AB 0 CT1 27XBRD ANBCTR (SUTURE) IMPLANT
SUT VIC AB 0 CT1 36 (SUTURE) ×8 IMPLANT
SUT VIC AB 2-0 CT1 27 (SUTURE) ×2
SUT VIC AB 2-0 CT1 TAPERPNT 27 (SUTURE) ×2 IMPLANT
SUT VIC AB 3-0 SH 27 (SUTURE) ×2
SUT VIC AB 3-0 SH 27X BRD (SUTURE) IMPLANT
SUT VIC AB 4-0 PS2 27 (SUTURE) IMPLANT
SYR CONTROL 10ML LL (SYRINGE) ×4 IMPLANT
TOWEL OR 17X24 6PK STRL BLUE (TOWEL DISPOSABLE) ×4 IMPLANT
TRAXI PANNICULUS RETRACTOR (MISCELLANEOUS) ×2
TRAY FOLEY W/BAG SLVR 14FR LF (SET/KITS/TRAYS/PACK) IMPLANT
WATER STERILE IRR 1000ML POUR (IV SOLUTION) ×4 IMPLANT

## 2018-11-19 NOTE — H&P (Signed)
Kayla [name] is a 38 y.o. female presenting @ 15 5/7 weeks in active labor . (+) ctx since 3 am. Intact membrane. GBS cx (+). Previous LTCS desires VBAC OB History    Gravida  4   Para  2   Term  2   Preterm  0   AB  1   Living  2     SAB  1   TAB  0   Ectopic  0   Multiple  0   Live Births  2          Past Medical History:  Diagnosis Date  . Abnormal Pap smear   . Headache(784.0)   . History of chlamydia   . History of hemorrhoids   . Morbidly obese (HCC)   . Pre-diabetes   . Varicose vulva in pregnancy    Past Surgical History:  Procedure Laterality Date  . CESAREAN SECTION N/A 04/27/2014   Procedure: CESAREAN SECTION;  Surgeon: Meriel Pica, MD;  Location: [facility];  Service: Obstetrics;  Laterality: N/A;  . CHOLECYSTECTOMY  2007  . COLONOSCOPY WITH PROPOFOL N/A 02/10/2016   Procedure: COLONOSCOPY WITH PROPOFOL;  Surgeon: Rachael Fee, MD;  Location: [facility];  Service: Endoscopy;  Laterality: N/A;  . OVARIAN CYST DRAINAGE  2003   Family History: family history includes Breast cancer in her maternal grandmother; Cancer in her paternal uncle; Cardiomyopathy in her father; Diabetes in her father; Heart attack in her father; Heart failure in her father; Hypertension in her father and paternal uncle. Social History:  reports that she has never smoked. She has never used smokeless tobacco. She reports that she does not drink alcohol or use drugs.     Maternal Diabetes: No Genetic Screening: Declined Maternal Ultrasounds/Referrals: Normal Fetal Ultrasounds or other Referrals:  None Maternal Substance Abuse:  No Significant Maternal Medications:  None Significant Maternal Lab Results:  Lab values include: Group B Strep positive Other Comments:  previous c/s, morbid obesity  Review of Systems  All other systems reviewed and are negative.  History Dilation: 5 Effacement (%): 70 Station: ("out of pelvis") Exam by:: Dr. Cherly Hensen Blood  pressure 109/67, pulse 74, temperature 97.6 F (36.4 C), temperature source Axillary, resp. rate 20, height 5\' 4"  (1.626 m), weight (!) 168 kg, unknown if currently breastfeeding. Exam Physical Exam  Constitutional: She is oriented to person, place, and time. She appears well-developed and well-nourished.  HENT:  Head: Atraumatic.  Eyes: EOM are normal.  Neck: Neck supple.  Respiratory: Effort normal.  GI: Soft.  Large pannus  Genitourinary:    Genitourinary Comments: Vulvar varicosities   Musculoskeletal:        General: Edema present.  Neurological: She is alert and oriented to person, place, and time.  Skin: Skin is warm and dry.  Psychiatric: She has a normal mood and affect.    Prenatal labs: ABO, Rh: --/--/PENDING (05/19 9357) Antibody: PENDING (05/19 0941) Rubella: Immune (10/21 0000) RPR: Nonreactive (10/21 0000)  HBsAg: Negative (10/21 0000)  HIV: Non-reactive (10/21 0000)  GBS: Positive (04/23 0000)   Assessment/Plan: Active labor Previous C/S desires VBAC  morbid obesity GBS cx (+) P) admit routine labs. IV PCN. Epidural. Desires PPTL if C/S indicated   Abby Stines A [name] Lehmkuhl 11/19/2018, 10:48 AM

## 2018-11-19 NOTE — Progress Notes (Signed)
S: some tailbone pressure  O: VE 9/-2 station/more cervix on right  tracing: baseline 150  Ctx q 2 mins decreased variability subtle variables  IMP: arrest of dilation GBS cx (+) on IV PCN  previous LTCS  morbid obesity P) right exaggerated sims. Defer pitocin due to contraction frequency. Cont with IV PCN. Guarded for vaginal delivery

## 2018-11-19 NOTE — MAU Note (Signed)
Pt is  g3p2 at 38.5 weeks c/o ctx since 0300.  Previous PCS with G2.  Plans VBAC.

## 2018-11-19 NOTE — Anesthesia Procedure Notes (Signed)
Epidural Patient location during procedure: OB Start time: 11/19/2018 11:00 AM End time: 11/19/2018 11:07 AM  Staffing Anesthesiologist: Cecile Hearing, MD Performed: anesthesiologist   Preanesthetic Checklist Completed: patient identified, pre-op evaluation, timeout performed, IV checked, risks and benefits discussed and monitors and equipment checked  Epidural Patient position: sitting Prep: DuraPrep Patient monitoring: blood pressure and continuous pulse ox Approach: midline Location: L3-L4 Injection technique: LOR air  Needle:  Needle type: Tuohy  Needle gauge: 17 G Needle length: 9 cm Needle insertion depth: 9 cm Catheter size: 19 Gauge Catheter at skin depth: 15 cm Test dose: negative and Other (1% Lidocaine)  Additional Notes Patient identified.  Risk benefits discussed including failed block, incomplete pain control, headache, nerve damage, paralysis, blood pressure changes, nausea, vomiting, reactions to medication both toxic or allergic, and postpartum back pain.  Patient expressed understanding and wished to proceed.  All questions were answered.  Sterile technique used throughout procedure and epidural site dressed with sterile barrier dressing. No paresthesia or other complications noted. The patient did not experience any signs of intravascular injection such as tinnitus or metallic taste in mouth nor signs of intrathecal spread such as rapid motor block. Please see nursing notes for vital signs. Reason for block:procedure for pain

## 2018-11-19 NOTE — Progress Notes (Signed)
S: no complaint  O: BP 112/76   Pulse 93   Temp 99 F (37.2 C) (Axillary)   Resp 18   Ht 5\' 4"  (1.626 m)   Wt (!) 168 kg   SpO2 100%   BMI 63.56 kg/m  VE 9/80-90% sl edema/-2 station IUPC out  Tracing: baseline 150 (+) variability (+) variable decel Ctx q 2-3 mins  IMP: arrest of dilation  previous C/S GBS cx (+) on IV PCN Desires sterilization P) recommend repeat C/S with BTL Risk of surgery reviewed including infection, bleeding, poss need for blood transfusion and its risk, internal scar tissue, injury to surrounding organ structures.  Permanent sterilization, non reversible, failure rate 1/300 All ? answered

## 2018-11-19 NOTE — Anesthesia Preprocedure Evaluation (Signed)
Anesthesia Evaluation  Patient identified by MRN, date of birth, ID band Patient awake    Reviewed: Allergy & Precautions, NPO status , Patient's Chart, lab work & pertinent test results  Airway Mallampati: III  TM Distance: >3 FB Neck ROM: Full    Dental  (+) Teeth Intact, Dental Advisory Given   Pulmonary neg pulmonary ROS,    Pulmonary exam normal breath sounds clear to auscultation       Cardiovascular negative cardio ROS Normal cardiovascular exam Rhythm:Regular Rate:Normal     Neuro/Psych  Headaches,    GI/Hepatic negative GI ROS, Neg liver ROS,   Endo/Other  Morbid obesity  Renal/GU negative Renal ROS     Musculoskeletal negative musculoskeletal ROS (+)   Abdominal   Peds  Hematology negative hematology ROS (+)   Anesthesia Other Findings Day of surgery medications reviewed with the patient.  Reproductive/Obstetrics (+) Pregnancy                             Anesthesia Physical Anesthesia Plan  ASA: IV  Anesthesia Plan: Epidural   Post-op Pain Management:    Induction:   PONV Risk Score and Plan: 2 and Treatment may vary due to age or medical condition  Airway Management Planned: Natural Airway  Additional Equipment:   Intra-op Plan:   Post-operative Plan:   Informed Consent: I have reviewed the patients History and Physical, chart, labs and discussed the procedure including the risks, benefits and alternatives for the proposed anesthesia with the patient or authorized representative who has indicated his/her understanding and acceptance.     Dental advisory given  Plan Discussed with:   Anesthesia Plan Comments:         Anesthesia Quick Evaluation

## 2018-11-19 NOTE — MAU Note (Signed)
Contractions started at 0330, now every 2-82min. No bleeding or leaking. No recent exam

## 2018-11-19 NOTE — Progress Notes (Signed)
S: comfortable  O: VE 9.5cm/100/-2  Controlled amniotomy with ISE  Clear copious Amniotic fluid ISE/IUPC placed.  tracing: pre-AROM  baseline 140 (+) variability some small variables  ctx q 2-3 mins After AROM. Had late decel followed by deep variable decel down to 80's  X   4 mins Followed by fetal tachycardia to 170's  X 8 mins then gradually return to baseline ~150 (+) variability  (+) scalp stimulation Maternal positioning  With resolution Amnioinfusion started  IMP> Active phase Previous C/S for VBAC  GBS cx (+) on IV PCN Variable decel 2nd to presumed cord  Compression P) close fetal monitoring,. Left exaggerated sims

## 2018-11-20 LAB — CBC
HCT: 30.8 % — ABNORMAL LOW (ref 36.0–46.0)
Hemoglobin: 10.3 g/dL — ABNORMAL LOW (ref 12.0–15.0)
MCH: 31 pg (ref 26.0–34.0)
MCHC: 33.4 g/dL (ref 30.0–36.0)
MCV: 92.8 fL (ref 80.0–100.0)
Platelets: 193 10*3/uL (ref 150–400)
RBC: 3.32 MIL/uL — ABNORMAL LOW (ref 3.87–5.11)
RDW: 13.6 % (ref 11.5–15.5)
WBC: 14.4 10*3/uL — ABNORMAL HIGH (ref 4.0–10.5)
nRBC: 0 % (ref 0.0–0.2)

## 2018-11-20 LAB — RPR: RPR Ser Ql: NONREACTIVE

## 2018-11-20 MED ORDER — OXYTOCIN 40 UNITS IN NORMAL SALINE INFUSION - SIMPLE MED: 2.5000 [IU]/h | INTRAVENOUS | Status: AC

## 2018-11-20 MED ORDER — DIPHENHYDRAMINE HCL 25 MG PO CAPS: 25.0000 mg | ORAL_CAPSULE | Freq: Four times a day (QID) | ORAL | Status: DC | PRN

## 2018-11-20 MED ORDER — SODIUM CHLORIDE 0.9% FLUSH: 3.0000 mL | INTRAVENOUS | Status: DC | PRN

## 2018-11-20 MED ORDER — NALOXONE HCL 4 MG/10ML IJ SOLN
1.0000 ug/kg/h | INTRAVENOUS | Status: DC | PRN
  Filled 2018-11-20: qty 5

## 2018-11-20 MED ORDER — SENNOSIDES-DOCUSATE SODIUM 8.6-50 MG PO TABS
2.0000 | ORAL_TABLET | ORAL | Status: DC
  Administered 2018-11-20 – 2018-11-21 (×2): 2 via ORAL
  Filled 2018-11-20 (×2): qty 2

## 2018-11-20 MED ORDER — FENTANYL CITRATE (PF) 100 MCG/2ML IJ SOLN
INTRAMUSCULAR | Status: AC
  Filled 2018-11-20: qty 2

## 2018-11-20 MED ORDER — METHYLERGONOVINE MALEATE 0.2 MG PO TABS: 0.2000 mg | ORAL_TABLET | ORAL | Status: DC | PRN

## 2018-11-20 MED ORDER — FLEET ENEMA 7-19 GM/118ML RE ENEM: 1.0000 | ENEMA | Freq: Every day | RECTAL | Status: DC | PRN

## 2018-11-20 MED ORDER — DIBUCAINE (PERIANAL) 1 % EX OINT: 1.0000 "application " | TOPICAL_OINTMENT | CUTANEOUS | Status: DC | PRN

## 2018-11-20 MED ORDER — KETOROLAC TROMETHAMINE 30 MG/ML IJ SOLN: 30.0000 mg | Freq: Four times a day (QID) | INTRAMUSCULAR | Status: AC | PRN

## 2018-11-20 MED ORDER — NALBUPHINE HCL 10 MG/ML IJ SOLN: 5.0000 mg | INTRAMUSCULAR | Status: DC | PRN

## 2018-11-20 MED ORDER — FENTANYL CITRATE (PF) 100 MCG/2ML IJ SOLN
25.0000 ug | INTRAMUSCULAR | Status: DC | PRN
  Administered 2018-11-20: 01:00:00 50 ug via INTRAVENOUS

## 2018-11-20 MED ORDER — METHYLERGONOVINE MALEATE 0.2 MG/ML IJ SOLN: 0.2000 mg | INTRAMUSCULAR | Status: DC | PRN

## 2018-11-20 MED ORDER — SODIUM CHLORIDE 0.9 % IV SOLN: 250.0000 mL | INTRAVENOUS | Status: DC

## 2018-11-20 MED ORDER — FERROUS SULFATE 325 (65 FE) MG PO TABS
325.0000 mg | ORAL_TABLET | Freq: Two times a day (BID) | ORAL | Status: DC
  Administered 2018-11-20 – 2018-11-22 (×5): 325 mg via ORAL
  Filled 2018-11-20 (×5): qty 1

## 2018-11-20 MED ORDER — MORPHINE SULFATE (PF) 0.5 MG/ML IJ SOLN
INTRAMUSCULAR | Status: DC | PRN
  Administered 2018-11-20: 3 mg via EPIDURAL
  Administered 2018-11-20: 2 mg via INTRAVENOUS

## 2018-11-20 MED ORDER — IBUPROFEN 800 MG PO TABS
800.0000 mg | ORAL_TABLET | Freq: Three times a day (TID) | ORAL | Status: DC
  Administered 2018-11-20 – 2018-11-22 (×7): 800 mg via ORAL
  Filled 2018-11-20 (×7): qty 1

## 2018-11-20 MED ORDER — PROMETHAZINE HCL 25 MG/ML IJ SOLN: 6.2500 mg | INTRAMUSCULAR | Status: DC | PRN

## 2018-11-20 MED ORDER — KETOROLAC TROMETHAMINE 30 MG/ML IJ SOLN
30.0000 mg | Freq: Four times a day (QID) | INTRAMUSCULAR | Status: AC | PRN
  Administered 2018-11-20: 30 mg via INTRAVENOUS

## 2018-11-20 MED ORDER — BISACODYL 10 MG RE SUPP: 10.0000 mg | Freq: Every day | RECTAL | Status: DC | PRN

## 2018-11-20 MED ORDER — ZOLPIDEM TARTRATE 5 MG PO TABS: 5.0000 mg | ORAL_TABLET | Freq: Every evening | ORAL | Status: DC | PRN

## 2018-11-20 MED ORDER — ACETAMINOPHEN 500 MG PO TABS
1000.0000 mg | ORAL_TABLET | Freq: Four times a day (QID) | ORAL | Status: DC
  Administered 2018-11-20 – 2018-11-22 (×9): 1000 mg via ORAL
  Filled 2018-11-20 (×10): qty 2

## 2018-11-20 MED ORDER — WITCH HAZEL-GLYCERIN EX PADS: 1.0000 "application " | MEDICATED_PAD | CUTANEOUS | Status: DC | PRN

## 2018-11-20 MED ORDER — OXYCODONE HCL 5 MG PO TABS: 5.0000 mg | ORAL_TABLET | Freq: Once | ORAL | Status: DC | PRN

## 2018-11-20 MED ORDER — SCOPOLAMINE 1 MG/3DAYS TD PT72
1.0000 | MEDICATED_PATCH | Freq: Once | TRANSDERMAL | Status: DC
  Filled 2018-11-20: qty 1

## 2018-11-20 MED ORDER — COCONUT OIL OIL: 1.0000 "application " | TOPICAL_OIL | Status: DC | PRN

## 2018-11-20 MED ORDER — OXYCODONE HCL 5 MG/5ML PO SOLN: 5.0000 mg | Freq: Once | ORAL | Status: DC | PRN

## 2018-11-20 MED ORDER — DIPHENHYDRAMINE HCL 25 MG PO CAPS: 25.0000 mg | ORAL_CAPSULE | ORAL | Status: DC | PRN

## 2018-11-20 MED ORDER — DIPHENHYDRAMINE HCL 50 MG/ML IJ SOLN: 12.5000 mg | INTRAMUSCULAR | Status: DC | PRN

## 2018-11-20 MED ORDER — OXYCODONE HCL 5 MG PO TABS
5.0000 mg | ORAL_TABLET | ORAL | Status: DC | PRN
  Administered 2018-11-21: 5 mg via ORAL
  Filled 2018-11-20: qty 1

## 2018-11-20 MED ORDER — NALBUPHINE HCL 10 MG/ML IJ SOLN: 5.0000 mg | Freq: Once | INTRAMUSCULAR | Status: DC | PRN

## 2018-11-20 MED ORDER — SIMETHICONE 80 MG PO CHEW
80.0000 mg | CHEWABLE_TABLET | ORAL | Status: DC
  Administered 2018-11-21: 80 mg via ORAL
  Filled 2018-11-20 (×2): qty 1

## 2018-11-20 MED ORDER — SODIUM CHLORIDE 0.9% FLUSH: 3.0000 mL | Freq: Two times a day (BID) | INTRAVENOUS | Status: DC

## 2018-11-20 MED ORDER — ONDANSETRON HCL 4 MG/2ML IJ SOLN: 4.0000 mg | Freq: Three times a day (TID) | INTRAMUSCULAR | Status: DC | PRN

## 2018-11-20 MED ORDER — MEPERIDINE HCL 25 MG/ML IJ SOLN: 6.2500 mg | INTRAMUSCULAR | Status: DC | PRN

## 2018-11-20 MED ORDER — SIMETHICONE 80 MG PO CHEW
80.0000 mg | CHEWABLE_TABLET | Freq: Three times a day (TID) | ORAL | Status: DC
  Administered 2018-11-20 – 2018-11-22 (×9): 80 mg via ORAL
  Filled 2018-11-20 (×9): qty 1

## 2018-11-20 MED ORDER — NALOXONE HCL 0.4 MG/ML IJ SOLN: 0.4000 mg | INTRAMUSCULAR | Status: DC | PRN

## 2018-11-20 MED ORDER — KETOROLAC TROMETHAMINE 30 MG/ML IJ SOLN
INTRAMUSCULAR | Status: AC
  Filled 2018-11-20: qty 1

## 2018-11-20 MED ORDER — PRENATAL MULTIVITAMIN CH
1.0000 | ORAL_TABLET | Freq: Every day | ORAL | Status: DC
  Administered 2018-11-20 – 2018-11-22 (×3): 1 via ORAL
  Filled 2018-11-20 (×3): qty 1

## 2018-11-20 MED ORDER — MENTHOL 3 MG MT LOZG: 1.0000 | LOZENGE | OROMUCOSAL | Status: DC | PRN

## 2018-11-20 MED ORDER — SIMETHICONE 80 MG PO CHEW: 80.0000 mg | CHEWABLE_TABLET | ORAL | Status: DC | PRN

## 2018-11-20 NOTE — Brief Op Note (Signed)
11/19/2018 - 11/20/2018  12:41 AM  PATIENT:  Kayla [name]  38 y.o. female  PRE-OPERATIVE DIAGNOSIS:  arrest of dilation, desires sterilization, previous cesarean section, morbid obesity  POST-OPERATIVE DIAGNOSIS:  arrest of dilation, desires sterilzation, previous cesarean section, morbid obesity  PROCEDURE:  Repeat cesarean section, kerr hysterotomy, Modified pomeroy tubal ligation  SURGEON:  Surgeon(s) and Role:    * Maxie Better, MD - Primary  PHYSICIAN ASSISTANT:   ASSISTANTS: meredith sigmon, MD   ANESTHESIA:   epidural Finding: LOP, live female. CAN x 1, loop next to chest, true knot, post placenta, nl tubes and ovaries EBL:  418 mL   BLOOD ADMINISTERED:none  DRAINS: none   LOCAL MEDICATIONS USED:  MARCAINE     SPECIMEN:  Source of Specimen:  placenta, portion of right and left tube  DISPOSITION OF SPECIMEN:  PATHOLOGY  COUNTS:  YES  TOURNIQUET:  * No tourniquets in log *  DICTATION: .Other Dictation: Dictation Number [id number]  PLAN OF CARE: Admit to inpatient   PATIENT DISPOSITION:  PACU - hemodynamically stable.   Delay start of Pharmacological VTE agent (>24hrs) due to surgical blood loss or risk of bleeding: no

## 2018-11-20 NOTE — Op Note (Signed)
NAME: Bobbie StackHARRIS, Sonal L. MEDICAL RECORD [id number] ACCOUNT [id number] DATE OF BIRTH:14-Oct-1980 FACILITY: [facility] LOCATION: [facility] PHYSICIAN:Ady Heimann A. Abia Monaco, MD  OPERATIVE REPORT  DATE OF PROCEDURE:  11/19/2018  PREOPERATIVE DIAGNOSES:  1.   Arrest of dilatation.   2.  Morbid obesity.   3.  Desires sterilization. 4.  Previous cesarean section.  POSTOPERATIVE DIAGNOSES:  1.   Arrest of dilatation.   2.  Morbid obesity.   3.  Desires sterilization. 4.  Previous cesarean section.  PROCEDURE:   1.  Repeat cesarean section, kerr hysterotomy.   2.  Modified Pomeroy and tubal ligation.      ANESTHESIA:  Epidural.  SURGEON:  Maxie BetterSheronette Keelin Sheridan, MD  ASSISTANT:  Sherrilee GillesMeredith Sigman, CNM.  DESCRIPTION OF PROCEDURE:  Under adequate epidural anesthesia, the patient was placed in the supine position with a left lateral tilt.  She was sterilely prepped and draped in the usual fashion.  A Traxit was used to the patient's large pannus.  Marcaine 0.25% was injected along the previous Pfannenstiel skin incision site.  Pfannenstiel skin incision was then made, carried down to the rectus fascia.  The rectus fascia was opened transversely.  The rectus fascia was sharply dissected off the rectus  muscle in a superior and inferior fashion.  Superiorly, there was an opening was made and blunt dissection was utilized to further allow for visualization and management of the rectus fascia with Mayo scissors.  The rectus muscle was adherent to the  midline.  Scalpel was used to open the midline incision and to carefully extend the parietal peritoneum.  The bladder had some adhesion to the lower uterine segment..  A self-retaining Alexis retractor was then placed.  The bladder peritoneum was opened  transversely and bluntly, displaced inferiorly.  Bladder retractor was in addition was also placed.  Curvilinear low transverse incision was then made and extended bluntly in a superior and inferior fashion.  A  loop of cord bounced through the incision.   Subsequent delivery of a live female from the left occiput posterior presentation with nuchal cord x1 reducible.  Bulb suctioned was done on the abdomen.  Vacuum was used to deliver the baby due to inability to deliver after several attempts. The baby was slightly floppy and therefore, delayed cord clamp was not done.  Cord was clamped, cut.  The baby was transferred to the awaiting pediatricians who assigned Apgars of 7 and 9 at 1 and 5 minutes.  A true knot x1 was noted in  the cord as well.  The placenta was manually removed.  Uterine cavity was cleaned of debris.  Uterine incision was inspected, no extension noted.  The incision was closed in 2 layers, the first layer of 0 Monocryl running lock stitch, the second layer  was imbricating using 0 Monocryl suture.  On the right angle side, a figure-of-eight suture was placed for hemostasis.  Abdomen was irrigated and suctioned of debris.   Attention was then turned to the fallopian tubes.  Both were identified down to their fimbriated end.  Both ovaries were normal.  The midportion of both tubes was grasped with a Babcock.  The underlying mesosalpinx was opened with cautery and the proximal and distal portion of the tube was tied with 0 chromic suture x2 proximally and distally and the intervening segment of both sides was removed.  Small bleeding was then noted on the lower inferior aspect of the uterus with cauterization used for hemostasis.  The Alexis retractor was subsequently removed.  The parietal  peritoneum was closed with 2-0 Vicryl.  Extensive time was made to achieve hemostasis onto the rectus fascia.  There was bleeding on the rectus muscle where a figure-of-eight 3-0 Vicryl suture was placed.  Once good hemostasis was achieved, the rectus fascia was closed with 0 Vicryl x2.  The subcutaneous area was irrigated.  Small bleeders cauterized.  Interrupted 2-0 plain sutures placed and the skin approximated  using Ethicon  staples.  Prevena vacuum for wound seroma/infection prevention was placed.  SPECIMEN:  Placenta.  A portion of right and left fallopian tube also sent to pathology.  ESTIMATED BLOOD LOSS:  518 mL.  URINE OUTPUT:  100 ml concentrated urine.  INTRAOPERATIVE FLUIDS:  1600 mL.  COUNTS:  Sponge and instrument counts x2 was correct.  COMPLICATIONS:  None.  DISPOSITION:  The patient tolerated the procedure well and was transferred to recovery room in stable condition.  AN/NUANCE  D:11/20/2018 T:11/20/2018 JOB:[id number]

## 2018-11-20 NOTE — Progress Notes (Signed)
Patient reports she was put on the continuous o2 sat and sleep apnea today.  She said she never have sleep apnea before and said it would be nice to be off of all the tubing so she could ambulate get to the baby easier.  Dr. Earlene Plater was called, and said it was okay to discontinue the sleep apnea order.

## 2018-11-20 NOTE — Transfer of Care (Signed)
Immediate Anesthesia Transfer of Care Note  Patient: Kayla [name]  Procedure(s) Performed: CESAREAN SECTION WITH BILATERAL TUBAL LIGATION (Bilateral )  Patient Location: PACU  Anesthesia Type:Epidural  Level of Consciousness: awake and alert   Airway & Oxygen Therapy: Patient Spontanous Breathing  Post-op Assessment: Report given to RN and Post -op Vital signs reviewed and stable  Post vital signs: Reviewed and stable  Last Vitals:  Vitals Value Taken Time  BP 95/48 11/20/2018 12:52 AM  Temp    Pulse 105 11/20/2018 12:55 AM  Resp 15 11/20/2018 12:55 AM  SpO2 100 % 11/20/2018 12:55 AM  Vitals shown include unvalidated device data.  Last Pain:  Vitals:   11/19/18 2100  TempSrc:   PainSc: 0-No pain      Patients Stated Pain Goal: 9 (11/19/18 1043)  Complications: No apparent anesthesia complications

## 2018-11-20 NOTE — Progress Notes (Signed)
Patient ID: Kayla [name], female   DOB: 1981/04/27, 38 y.o.   MRN: [id number] Subjective: POD# 1 Live born female  Birth Weight: 7 lb 13 oz (3544 g) APGAR: 9, 9  Newborn Delivery   Birth date/time:  11/19/2018 23:21:00 Delivery type:  C-Section, Vacuum Assisted Trial of labor:  Yes C-section categorization:  Repeat    Baby name: Shari Prows Delivering provider: COUSINS, [name]   circumcision planned Feeding: breast  Pain control at delivery: Epidural   Reports feeling well, has not been out of bed yet, waiting to eat breakfast first.   Patient reports tolerating PO.   Breast symptoms:+ colostrum Pain controlled with PO meds Denies HA/SOB/C/P/N/V/dizziness. Flatus present. She reports vaginal bleeding as normal, without clots. Stood at [facility], mild dizziness, foley cath in place.  Objective:   VS:    Vitals:   11/20/18 0220 11/20/18 0327 11/20/18 0427 11/20/18 0527  BP: 123/78 131/66 120/73 121/67  Pulse: 97 (!) 105 94 96  Resp: 16 15 14 15   Temp: (!) 100.5 F (38.1 C) 100.1 F (37.8 C) 98.1 F (36.7 C) 98.5 F (36.9 C)  TempSrc: Oral Axillary Oral Oral  SpO2: 98% 96% 98% 98%  Weight:      Height:          Intake/Output Summary (Last 24 hours) at 11/20/2018 1039 Last data filed at 11/20/2018 0933 Gross per 24 hour  Intake 2180 ml  Output 2468 ml  Net -288 ml        Recent Labs    11/19/18 0941 11/20/18 0552  WBC 5.6 14.4*  HGB 12.4 10.3*  HCT 36.3 30.8*  PLT 215 193     Blood type: --/--/O POS, O POS Performed at [facility], [address], [place], [place] [address]  [phone])  Rubella: Immune (10/21 0000)  Vaccines: TDaP declined         Flu    declined   Physical Exam:  General: alert, cooperative and no distress CV: Regular rate and rhythm Resp: clear Abdomen: soft, NT, obese, decreased BS Incision: intact and serous and small drainage present, Provena dressing patent Uterine Fundus: firm, below umbilicus, nontender Lochia:  minimal Ext: edema +1 pedal, no calf tenderness   Assessment/Plan: 38 y.o.   POD# 1. [id number]                  Principal Problem:   Postpartum care following cesarean delivery (5/19) Active Problems:   Indication for care in labor or delivery - TOLAC   Previous cesarean section complicating pregnancy, antepartum condition or complication Repeat C/S with BTL, arrest 9 cm   Doing well, stable.    DC foley cath after meal and encouraged to ambulate           Advance diet as tolerated Encourage rest when baby rests Breastfeeding support Routine post-op care  Neta Mends, CNM, MSN 11/20/2018, 10:39 AM

## 2018-11-20 NOTE — Anesthesia Postprocedure Evaluation (Signed)
Anesthesia Post Note  Patient: Kayla [name]  Procedure(s) Performed: CESAREAN SECTION WITH BILATERAL TUBAL LIGATION (Bilateral )     Patient location during evaluation: Mother Baby Anesthesia Type: Epidural Level of consciousness: awake Pain management: satisfactory to patient Vital Signs Assessment: post-procedure vital signs reviewed and stable Respiratory status: spontaneous breathing Cardiovascular status: stable Anesthetic complications: no    Last Vitals:  Vitals:   11/20/18 0427 11/20/18 0527  BP: 120/73 121/67  Pulse: 94 96  Resp: 14 15  Temp: 36.7 C 36.9 C  SpO2: 98% 98%    Last Pain:  Vitals:   11/20/18 0527  TempSrc: Oral  PainSc: 0-No pain   Pain Goal: Patients Stated Pain Goal: 1 (11/20/18 0115)                 Cephus Shelling

## 2018-11-20 NOTE — Anesthesia Postprocedure Evaluation (Signed)
Anesthesia Post Note  Patient: Sabirin L. Dolinger  Procedure(s) Performed: CESAREAN SECTION WITH BILATERAL TUBAL LIGATION (Bilateral )     Patient location during evaluation: PACU Anesthesia Type: Epidural Level of consciousness: awake and alert Pain management: pain level controlled Vital Signs Assessment: post-procedure vital signs reviewed and stable Respiratory status: spontaneous breathing, respiratory function stable and nonlabored ventilation Cardiovascular status: blood pressure returned to baseline Postop Assessment: epidural receding and no apparent nausea or vomiting Anesthetic complications: no    Last Vitals:  Vitals:   11/20/18 0220 11/20/18 0327  BP: 123/78 131/66  Pulse: 97 (!) 105  Resp: 16 15  Temp: (!) 38.1 C 37.8 C  SpO2: 98% 96%    Last Pain:  Vitals:   11/20/18 0327  TempSrc: Axillary  PainSc:    Pain Goal: Patients Stated Pain Goal: 1 (11/20/18 0115)                 Beryle Lathe

## 2018-11-20 NOTE — Progress Notes (Signed)
With patient's first ambulation,orthostatic vital  signs were B/P 86/46, HR 79 (lying), B/P 95/54, HR 87 (sitting), B/P 100/60, HR 89 (standing). Patient was asymptomatic, ambulated to bathroom, pericare done and assisted back to bed. Notified Arlan Organ, CNM.

## 2018-11-20 NOTE — Lactation Note (Signed)
This note was copied from a baby's chart. Lactation Consultation Note  Patient Name: Kayla [name] [id number] Date: 11/20/2018 Reason for consult: Initial assessment;Early term 37-38.6wks P3, 7 hour female infant, ETI and C/S delivery. Had two stools since delivery. Mom is experienced at breastfeeding,  mom breastfeed her 1st child for 18 months and 2nd child for 8 months. Mom awaiting delivery DEBP order through her insurance company. Mom latched infant on left breast using football hold, infant on and off breast, per mom doesn't latch as well on left breast as he does the right breast. Infant had breastfeed for 6 minutes and still breastfeeding when LC left the room. LC asked mom hand express and mom taught back, smear of colostrum present on right breast only. Mom plans to pump every 3 hours for 15 minutes after breastfeeding for breast stimulation and milk induction.  Mom knows to ask for assistance from Nurse or LC if she has any questions, concerns or need assistance with latching infant to breast. LC discussed parents to do as much STS as possible. LC discussed I & O. Reviewed Baby & Me book's Breastfeeding Basics.  Mom made aware of O/P services, breastfeeding support groups, community resources, and our phone # for post-discharge questions.   Maternal Data Formula Feeding for Exclusion: No Has patient been taught Hand Expression?: Yes(smear of colostrum present only in left breast.) Does the patient have breastfeeding experience prior to this delivery?: Yes  Feeding Feeding Type: Breast Fed  LATCH Score Latch: Repeated attempts needed to sustain latch, nipple held in mouth throughout feeding, stimulation needed to elicit sucking reflex.  Audible Swallowing: A few with stimulation  Type of Nipple: Everted at rest and after stimulation  Comfort (Breast/Nipple): Soft / non-tender  Hold (Positioning): Assistance needed to correctly position infant at breast and maintain  latch.  LATCH Score: 7  Interventions Interventions: Breast feeding basics reviewed;Skin to skin;Breast compression;Breast massage;Adjust position;Support pillows;Hand express;Assisted with latch;Position options;DEBP;Hand pump  Lactation Tools Discussed/Used Tools: Pump Breast pump type: Double-Electric Breast Pump WIC Program: No Pump Review: Setup, frequency, and cleaning;Milk Storage Initiated by:: by Nurse  Date initiated:: 11/20/18   Consult Status Consult Status: Follow-up Date: 11/20/18 Follow-up type: In-patient    Danelle Earthly 11/20/2018, 6:43 AM

## 2018-11-20 NOTE — Addendum Note (Signed)
Addendum  created 11/20/18 0802 by Algis Greenhouse, CRNA   Charge Capture section accepted, Clinical Note Signed, Visit diagnoses modified

## 2018-11-21 ENCOUNTER — Inpatient Hospital Stay (HOSPITAL_COMMUNITY): Admission: RE | Admit: 2018-11-21 | Payer: 59 | Source: Home / Self Care | Admitting: Obstetrics and Gynecology

## 2018-11-21 ENCOUNTER — Encounter (HOSPITAL_COMMUNITY): Payer: Self-pay

## 2018-11-21 ENCOUNTER — Encounter (HOSPITAL_COMMUNITY): Admission: RE | Payer: Self-pay | Source: Home / Self Care

## 2018-11-21 SURGERY — Surgical Case
Anesthesia: Spinal | Laterality: Bilateral

## 2018-11-21 NOTE — Progress Notes (Signed)
POSTOPERATIVE DAY # 2 S/P Repeat LTCS for arrest of dilation with Bilateral Tubal Ligation, baby boy "Shari Prowsvan"  S:         Reports feeling okay, tired - reports she has only slept 5-6 hours since delivery              Tolerating po intake / no nausea / no vomiting / + flatus / + BM  Denies dizziness, SOB, or CP             Bleeding is light             Pain controlled with Motrin and Tylenol             Up ad lib / ambulatory/ voiding QS  Newborn breast feeding - reports it is going well; (+) colostrum / Circumcision planning prior to discharge   O:  VS: BP (!) 105/47 (BP Location: Left Arm)   Pulse 84   Temp 97.9 F (36.6 C) (Oral)   Resp 18   Ht 5\' 4"  (1.626 m)   Wt (!) 168 kg   SpO2 99%   Breastfeeding Unknown   BMI 63.56 kg/m    LABS:               Recent Labs    11/19/18 0941 11/20/18 0552  WBC 5.6 14.4*  HGB 12.4 10.3*  PLT 215 193               Bloodtype: --/--/O POS, O POS Performed at [facility], [address] [place], [place] [address]  [phone](05/19 0941)  Rubella: Immune (10/21 0000)                                             I&O: Intake/Output      05/20 0701 - 05/21 0700 05/21 0701 - 05/22 0700   P.O. 1080    I.V. (mL/kg)     Total Intake(mL/kg) 1080 (6.4)    Urine (mL/kg/hr) 2650 (0.7)    Blood     Total Output 2650    Net -1570                      Physical Exam:             Alert and Oriented X3  Lungs: Clear and unlabored  Heart: regular rate and rhythm / no murmurs  Abdomen: soft, non-tender, non-distended, obese, active bowel sounds              Fundus: firm, non-tender, U-1             Dressing: Prevena wound vac c/d/i              Incision:  approximated with staples /unable to visualize due to wound vac  Perineum: intact   Lochia: small amount   Extremities: +2 pitting LEedema, no calf pain or tenderness,   A:        POD # 2 S/P Repeat LTCS with bilateral tubal ligation            ABL Anemia - stable, asymptomatic on oral  FE  Dependent edema - per pt that is her baseline    P:        Routine postoperative care              Encouraged to shower  then nap and then ambulate in halls after nap  Continue working with lactation  Anticipate discharge home tomorrow   Carlean Jews, MSN, CNM [facility]

## 2018-11-22 MED ORDER — SENNOSIDES-DOCUSATE SODIUM 8.6-50 MG PO TABS: 2.0000 | ORAL_TABLET | ORAL | Status: DC

## 2018-11-22 MED ORDER — SIMETHICONE 80 MG PO CHEW: 80.0000 mg | CHEWABLE_TABLET | Freq: Three times a day (TID) | ORAL | 0 refills | Status: DC

## 2018-11-22 MED ORDER — OXYCODONE HCL 5 MG PO TABS: 5.0000 mg | ORAL_TABLET | ORAL | 0 refills | Status: DC | PRN

## 2018-11-22 MED ORDER — COCONUT OIL OIL: 1.0000 "application " | TOPICAL_OIL | 0 refills | Status: DC | PRN

## 2018-11-22 MED ORDER — ACETAMINOPHEN 500 MG PO TABS: 1000.0000 mg | ORAL_TABLET | Freq: Four times a day (QID) | ORAL | 0 refills | Status: AC

## 2018-11-22 MED ORDER — IBUPROFEN 800 MG PO TABS: 800.0000 mg | ORAL_TABLET | Freq: Three times a day (TID) | ORAL | 0 refills | Status: DC

## 2018-11-22 NOTE — Lactation Note (Signed)
This note was copied from a baby's chart. Lactation Consultation Note  Patient Name: Kayla [name] [id number] Date: 11/22/2018 Reason for consult: Follow-up assessment;Term  Visited with P3 Mom of [redacted]w[redacted]d infant at 45 hrs old. Baby at 7% weight loss with adequate output.  Baby has been latching and breastfeeding well.  Mom has chosen to start double pumping between breastfeeding to support her milk supply.  Mom has a pump at home.  Encouraged her to focus on breastfeeding baby rather than a lot of pumping.  One pumping per day is enough with a baby that is latching well and often enough.    Encouraged STS and feeding baby often with cues.  Goal is 8-12 feedings per 24 hrs.    Engorgement prevention and treatment reviewed.    Mom aware of OP lactation support available to her, and encouraged her to call prn for concerns.  Baby being circumcised currently.  Encouraged STS when he returns from CN.  Interventions Interventions: Breast feeding basics reviewed;Skin to skin;Breast massage;Hand express;Coconut oil  Lactation Tools Discussed/Used Tools: Pump Breast pump type: Double-Electric Breast Pump   Consult Status Consult Status: Complete Date: 11/22/18 Follow-up type: Call as needed    [name] 11/22/2018, 8:56 AM

## 2018-11-22 NOTE — Progress Notes (Signed)
Patient ID: Kayla [name], female   DOB: [birth year], 38 y.o.   MRN: [id number] Subjective: POD# 3 Live born female  Birth Weight: 7 lb 13 oz (3544 g) APGAR: 9, 9  Newborn Delivery   Birth date/time:  11/19/2018 23:21:00 Delivery type:  C-Section, Vacuum Assisted Trial of labor:  Yes C-section categorization:  Repeat    Baby name: Shari Prows Delivering provider: COUSINS, [name]   circumcision completed Feeding: breast  Pain control at delivery: Epidural   Reports feeling well.  Patient reports tolerating PO.   Breast symptoms: none Pain controlled with PO meds Denies HA/SOB/C/P/N/V/dizziness. Flatus present. She reports vaginal bleeding as normal, without clots.  She is ambulating, urinating without difficulty.     Objective:   VS:    Vitals:   11/20/18 2112 11/21/18 0557 11/21/18 1500 11/22/18 0548  BP: (!) 94/50 (!) 105/47 (!) 117/50 115/74  Pulse: 73 84 80 81  Resp: 18 18 17 16   Temp: 97.8 F (36.6 C) 97.9 F (36.6 C) 98.3 F (36.8 C) 98.1 F (36.7 C)  TempSrc: Oral Oral Oral Oral  SpO2: 99% 99%  99%  Weight:      Height:        No intake or output data in the 24 hours ending 11/22/18 0823      Recent Labs    11/19/18 0941 11/20/18 0552  WBC 5.6 14.4*  HGB 12.4 10.3*  HCT 36.3 30.8*  PLT 215 193     Blood type: --/--/O POS, O POS Performed at [facility], [address] [place], [place] [address]  [phone])  Rubella: Immune (10/21 0000)  Vaccines: TDaP declined         Flu    declined   Physical Exam:  General: alert, cooperative and no distress Abdomen: soft, nontender, normal bowel sounds Incision: prevena dressing reinforced for patency Uterine Fundus: firm, below umbilicus, nontender Lochia: minimal Ext: edema +2 pedal/pretibial      Assessment/Plan: 38 y.o.   POD# 3. [id number]                  Principal Problem:   Postpartum care following cesarean delivery (5/19) Active Problems:   Indication for care in labor or  delivery   Previous cesarean section complicating pregnancy, antepartum condition or complication   Normal labor   Doing well, stable.    Routine post-op care             DC home today w/ instructions  F/U at [facility] in 1 weeks for dressing and staples removal and PRN   Neta Mends, CNM, MSN 11/22/2018, 8:23 AM

## 2018-11-22 NOTE — Lactation Note (Signed)
This note was copied from a baby's chart. Lactation Consultation Note  Patient Name: Kayla [name] [id number] Date: 11/22/2018 Reason for consult: Follow-up assessment;Early term 37-38.6wks;Infant weight loss P3, 53 hour female infant, weight loss-5%. Per parents, infant had 8 stools and 3 voids. Per mom, she is breastfeed and started pumping afterwards and now pumping 10 ml of EBM. Mom latched infant on left breast using football hold, infant suckling in rthymitic pattern and was still breastfeeding (15 minutes) when LC left the room. Mom complains of breast soreness no trauma noted on breast, mom was given coconut oil and explained how to use. Mom feels breast flange is too small LC re-fitted mom with size 30 mm breast flange. Mom knows to breastfeed according hunger cues, on demand, 8 -12 times within 24 hours. Mom plans to breastfeed first, then pump and give infant back any pumped breast milk.  Mom knows to call Nurse or LC if she has any questions, concerns or need assistance with latching infant to breast.   Maternal Data    Feeding Feeding Type: Breast Fed  LATCH Score Latch: Grasps breast easily, tongue down, lips flanged, rhythmical sucking.  Audible Swallowing: Spontaneous and intermittent  Type of Nipple: Inverted  Comfort (Breast/Nipple): Soft / non-tender  Hold (Positioning): No assistance needed to correctly position infant at breast.  LATCH Score: 8  Interventions Interventions: Coconut oil  Lactation Tools Discussed/Used     Consult Status Consult Status: Follow-up Date: 11/22/18 Follow-up type: In-patient    Danelle Earthly 11/22/2018, 4:30 AM

## 2018-11-22 NOTE — Discharge Summary (Addendum)
OB Discharge Summary  Patient Name: Kayla [name] DOB: 05-15-1981 MRN: [id number]  Date of admission: 11/19/2018 Delivering provider: Zanaria [name], Kayla [name]   Date of discharge: 11/22/2018  Admitting diagnosis: multiparity, previous cesarean section, active labor Intrauterine pregnancy: 38 5/7 week    Secondary diagnosis:Principal Problem:   Postpartum care following cesarean delivery (5/19) Active Problems:   Indication for care in labor or delivery   Previous cesarean section complicating pregnancy, antepartum condition or complication   Normal labor desires sterilization Additional problems:morbid obesity     Discharge diagnosis:  Patient Active Problem List   Diagnosis Date Noted  . Postpartum care following cesarean delivery (5/19) 11/20/2018  . Indication for care in labor or delivery 11/19/2018  . Previous cesarean section complicating pregnancy, antepartum condition or complication 11/19/2018  . Normal labor 11/19/2018  . Advanced maternal age (AMA) in pregnancy 02/13/2014   desires sterilization                                                             Post partum procedures:none  Augmentation: AROM Pain control: Epidural  Laceration:None  Episiotomy:None  Complications: None   Hospital course:  Induction of Labor With Cesarean Section  38 y.o. yo [phone] at 2669w5d was admitted to the hospital 11/19/2018 in active labor. Pt desires VBAC as well as sterilization if C/S.  Patient had a labor course significant for arrest of dilation. The patient went for  Repeat cesarean section, BTL  due to Arrest of Dilation and failed TOLAC, and delivered a Viable infant,11/19/2018  Membrane Rupture Time/Date: 5:39 PM ,11/19/2018   Details of operation can be found in separate operative Note.  Patient had an uncomplicated postpartum course. She is ambulating, tolerating a regular diet, passing flatus, and urinating well.  Patient is discharged home in stable condition on 11/22/18.                                     Physical exam  Vitals:   11/20/18 2112 11/21/18 0557 11/21/18 1500 11/22/18 0548  BP: (!) 94/50 (!) 105/47 (!) 117/50 115/74  Pulse: 73 84 80 81  Resp: 18 18 17 16   Temp: 97.8 F (36.6 C) 97.9 F (36.6 C) 98.3 F (36.8 C) 98.1 F (36.7 C)  TempSrc: Oral Oral Oral Oral  SpO2: 99% 99%  99%  Weight:      Height:       General: alert, cooperative and no distress Lochia: appropriate Uterine Fundus: firm Incision: Healing well with no significant drainage, Prevena dressing patent DVT Evaluation: No cords or calf tenderness. Calf/Ankle edema is present Labs: Lab Results  Component Value Date   WBC 14.4 (H) 11/20/2018   HGB 10.3 (L) 11/20/2018   HCT 30.8 (L) 11/20/2018   MCV 92.8 11/20/2018   PLT 193 11/20/2018   CMP Latest Ref Rng & Units 11/18/2015  Glucose 65 - 99 mg/dL 92  BUN 7 - 25 mg/dL 7  Creatinine 4.010.50 - 0.271.10 mg/dL 2.530.77  Sodium 664135 - 403146 mmol/L 138  Potassium 3.5 - 5.3 mmol/L 4.2  Chloride 98 - 110 mmol/L 105  CO2 20 - 31 mmol/L 25  Calcium 8.6 - 10.2 mg/dL 9.0  Total Protein 6.1 - 8.1 g/dL  7.9  Total Bilirubin 0.2 - 1.2 mg/dL 0.6  Alkaline Phos 33 - 115 U/L 46  AST 10 - 30 U/L 25  ALT 6 - 29 U/L 30(H)    Vaccines: TDaP declined         Flu    declined  Discharge instruction: per After Visit Summary and "Baby and Me Booklet".  After Visit Meds:  Allergies as of 11/22/2018      Reactions   Bactrim Nausea And Vomiting, Other (See Comments)   Overall makes her feel "horrible"  Body aches n/v, HA      Medication List    TAKE these medications   acetaminophen 500 MG tablet Commonly known as:  TYLENOL Take 2 tablets (1,000 mg total) by mouth every 6 (six) hours.   coconut oil Oil Apply 1 application topically as needed.   ibuprofen 800 MG tablet Commonly known as:  ADVIL Take 1 tablet (800 mg total) by mouth every 8 (eight) hours.   oxyCODONE 5 MG immediate release tablet Commonly known as:  Oxy  IR/ROXICODONE Take 1-2 tablets (5-10 mg total) by mouth every 4 (four) hours as needed for moderate pain.   Prenatal Vitamins 0.8 MG tablet Take 1 tablet by mouth daily.   senna-docusate 8.6-50 MG tablet Commonly known as:  Senokot-S Take 2 tablets by mouth daily. Start taking on:  Nov 23, 2018   simethicone 80 MG chewable tablet Commonly known as:  MYLICON Chew 1 tablet (80 mg total) by mouth 3 (three) times daily after meals.       Diet: routine diet  Activity: Advance as tolerated. Pelvic rest for 6 weeks.   Postpartum contraception: BTL  Newborn Data: Live born female  Birth Weight: 7 lb 13 oz (3544 g) APGAR: 9, 9  Newborn Delivery   Birth date/time:  11/19/2018 23:21:00 Delivery type:  C-Section, Vacuum Assisted Trial of labor:  Yes C-section categorization:  Repeat     named Kayla [name] Feeding: Breast Disposition:home with mother   Delivery Report:  Review the Delivery Report for details.    Follow up: Follow-up Information    Kayla Better, MD Follow up in 1 week(s).   Specialty:  Obstetrics and Gynecology Contact information: [address] [place] [address] [phone]             Signed: Neta [name], CNM, MSN 11/22/2018, 11:09 AM

## 2018-11-24 ENCOUNTER — Encounter (HOSPITAL_COMMUNITY): Payer: Self-pay | Admitting: Obstetrics and Gynecology

## 2018-11-26 ENCOUNTER — Other Ambulatory Visit (HOSPITAL_COMMUNITY)
Admission: RE | Admit: 2018-11-26 | Discharge: 2018-11-26 | Disposition: A | Discharge status: Home / Self Care | Payer: 59 | Source: Ambulatory Visit

## 2018-11-28 ENCOUNTER — Inpatient Hospital Stay (HOSPITAL_COMMUNITY): Payer: 59

## 2018-12-02 ENCOUNTER — Other Ambulatory Visit: Payer: Self-pay

## 2018-12-02 ENCOUNTER — Encounter (HOSPITAL_BASED_OUTPATIENT_CLINIC_OR_DEPARTMENT_OTHER): Payer: 59 | Attending: Internal Medicine

## 2018-12-02 ENCOUNTER — Other Ambulatory Visit (HOSPITAL_COMMUNITY)
Admission: RE | Admit: 2018-12-02 | Discharge: 2018-12-02 | Disposition: A | Discharge status: Home / Self Care | Payer: 59 | Source: Other Acute Inpatient Hospital | Attending: Internal Medicine | Admitting: Internal Medicine

## 2018-12-02 DIAGNOSIS — O900 Disruption of cesarean delivery wound: Secondary | ICD-10-CM | POA: Insufficient documentation

## 2018-12-02 DIAGNOSIS — T8131XD Disruption of external operation (surgical) wound, not elsewhere classified, subsequent encounter: Secondary | ICD-10-CM | POA: Insufficient documentation

## 2018-12-02 DIAGNOSIS — I10 Essential (primary) hypertension: Secondary | ICD-10-CM | POA: Diagnosis not present

## 2018-12-05 LAB — AEROBIC CULTURE W GRAM STAIN (SUPERFICIAL SPECIMEN): Gram Stain: NONE SEEN

## 2018-12-09 DIAGNOSIS — O900 Disruption of cesarean delivery wound: Secondary | ICD-10-CM | POA: Diagnosis not present

## 2018-12-16 DIAGNOSIS — O900 Disruption of cesarean delivery wound: Secondary | ICD-10-CM | POA: Diagnosis not present

## 2018-12-30 DIAGNOSIS — O900 Disruption of cesarean delivery wound: Secondary | ICD-10-CM | POA: Diagnosis not present

## 2019-02-14 IMAGING — US US ABDOMEN COMPLETE
1 series · 13 of 25 positions shown · non-contrast
Comparison: None.

CLINICAL DATA: Upper abdominal pain

EXAM:
ABDOMEN ULTRASOUND COMPLETE

[Series 1: us abdomen complete · 13 of 81 slices shown]
[im 1/81]
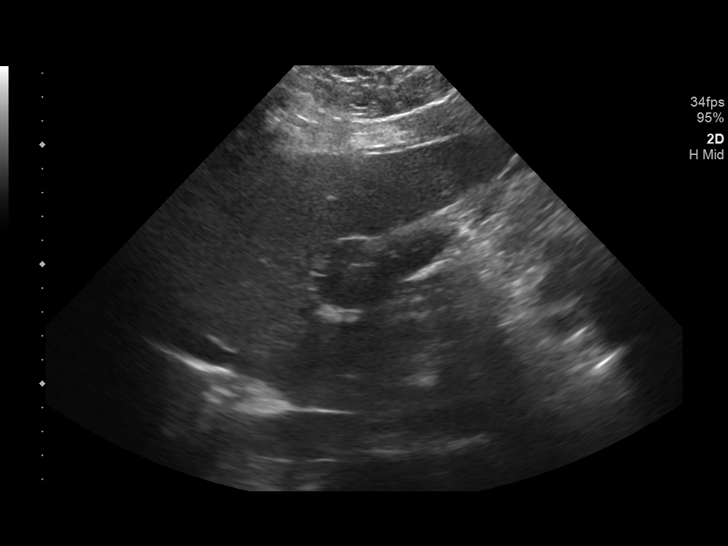
[im 7/81]
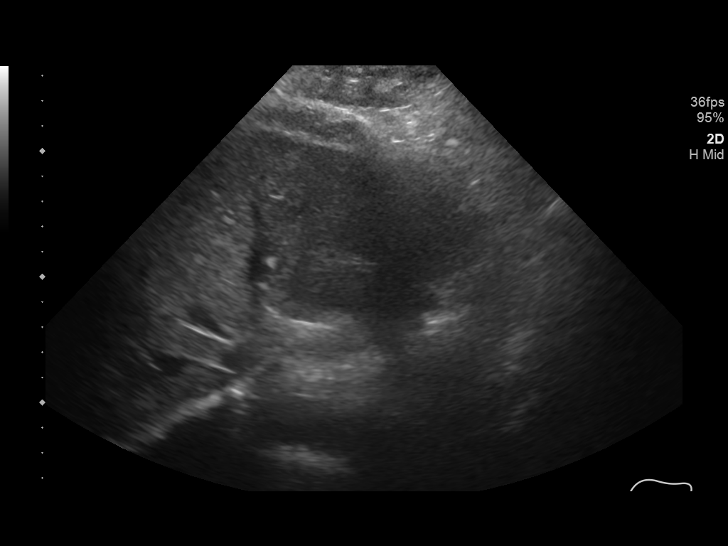
[im 14/81]
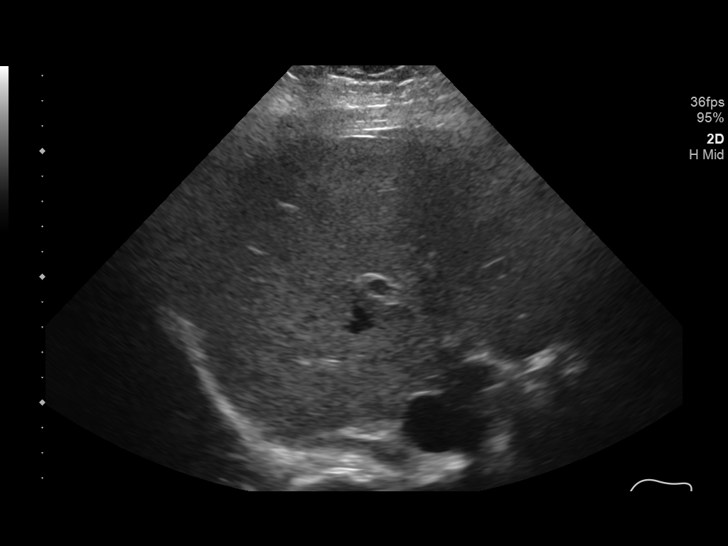
[im 21/81]
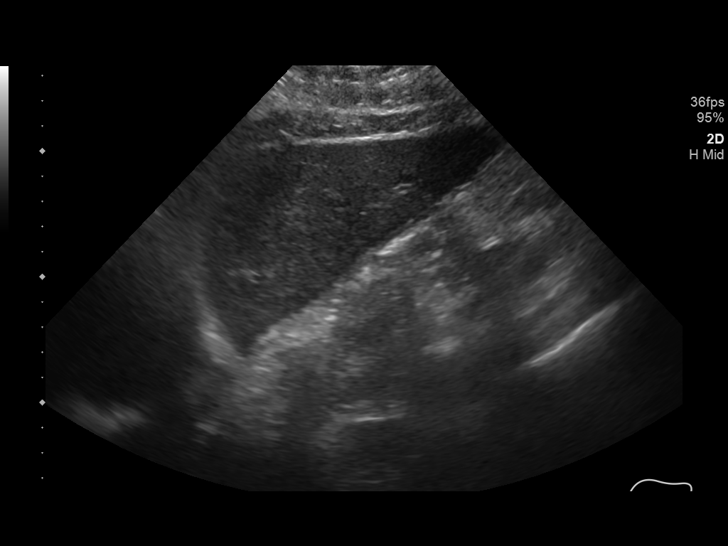
[im 27/81]
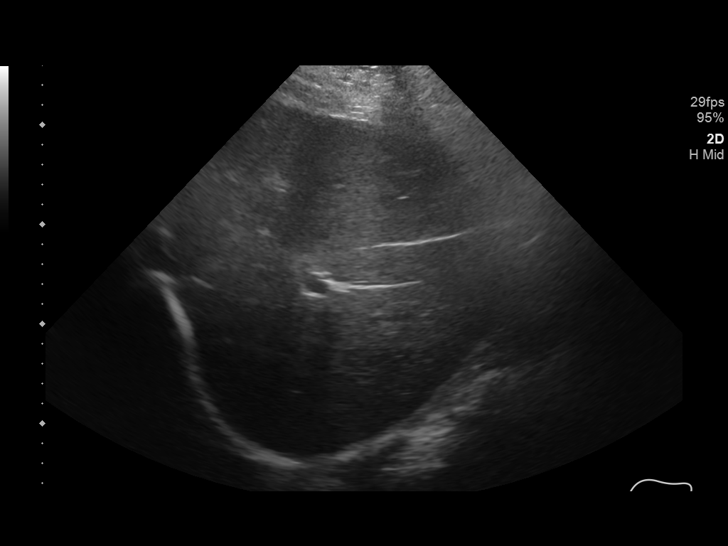
[im 34/81]
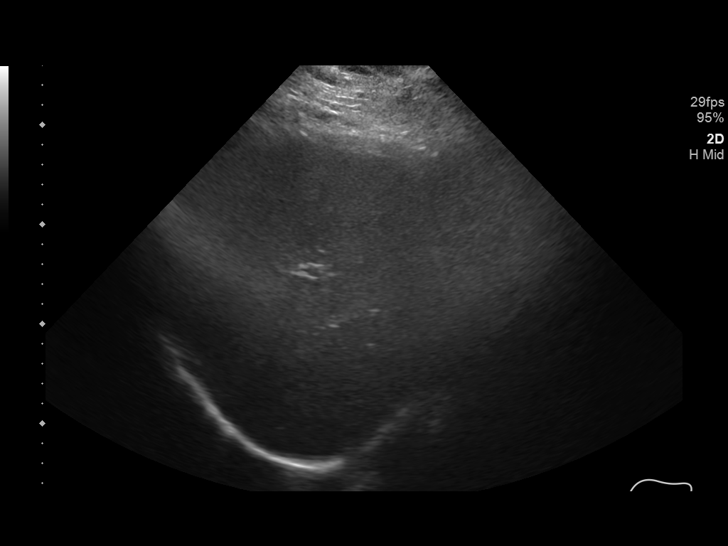
[im 41/81]
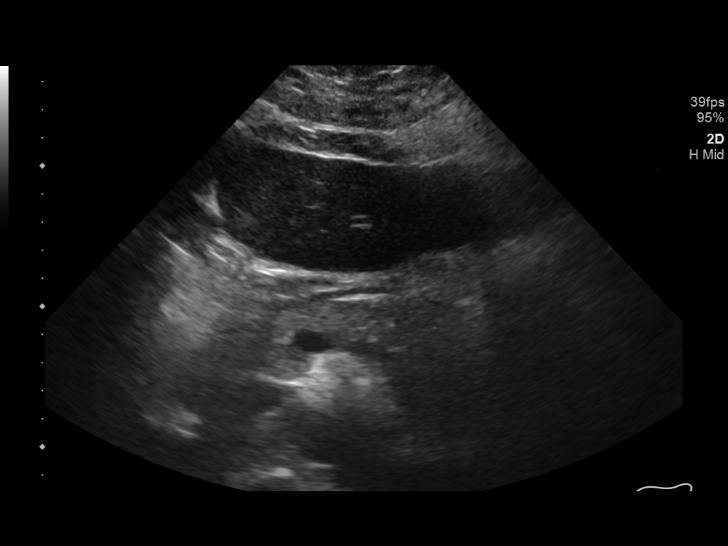
[im 47/81]
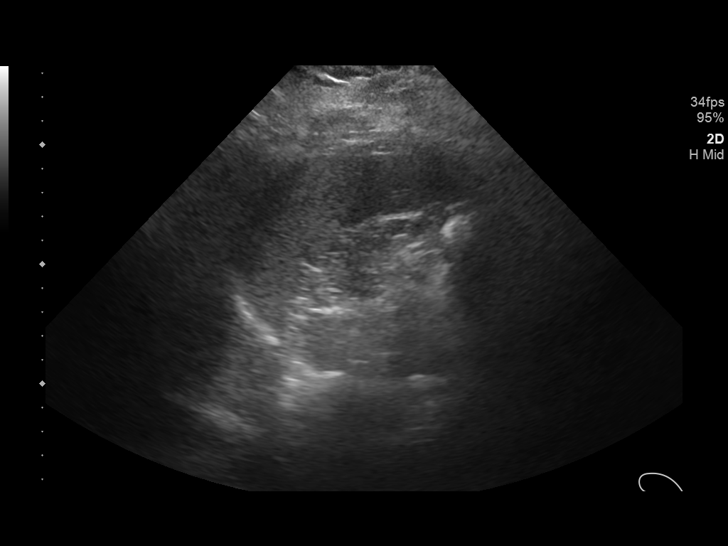
[im 54/81]
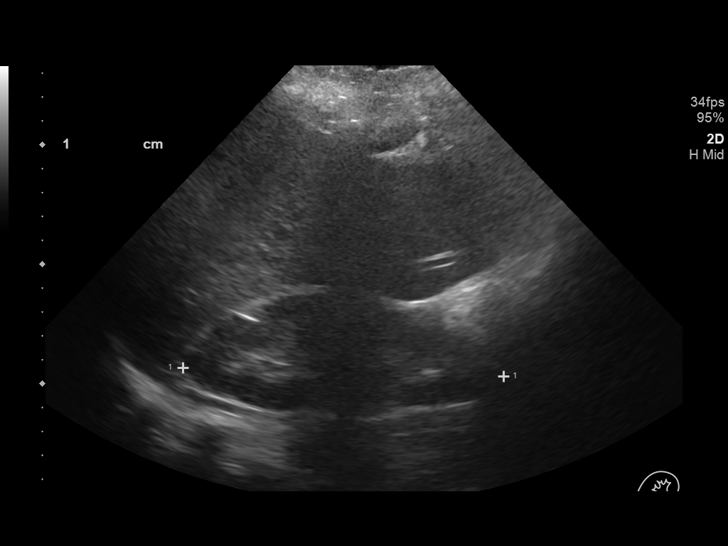
[im 61/81]
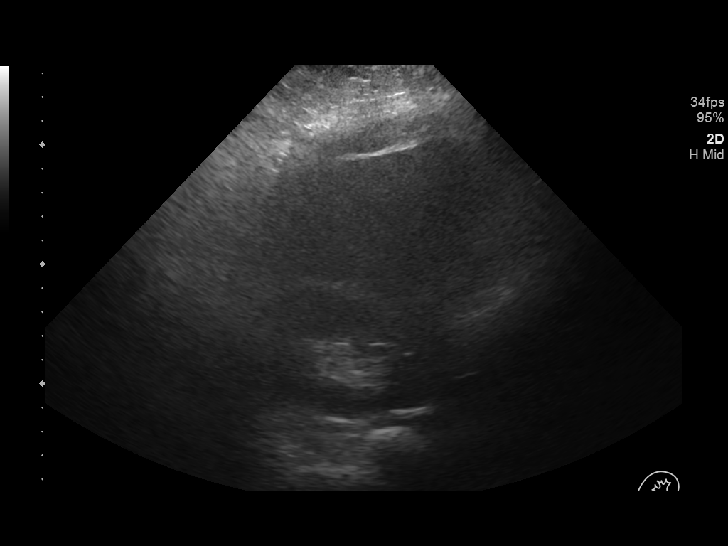
[im 67/81]
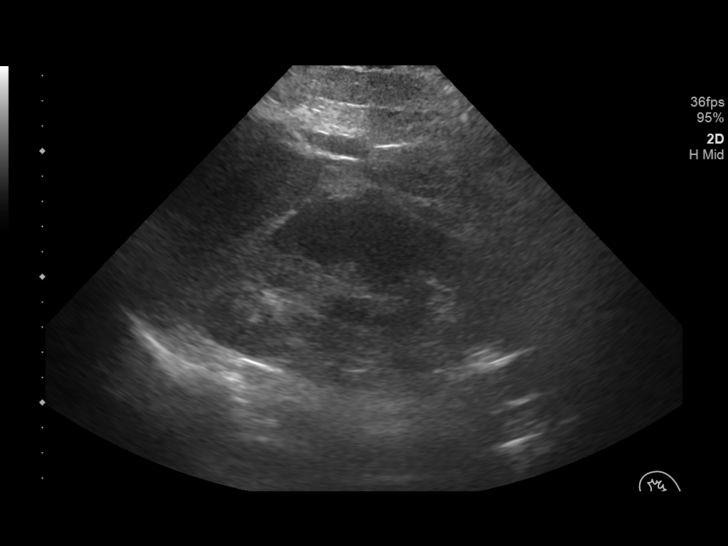
[im 74/81]
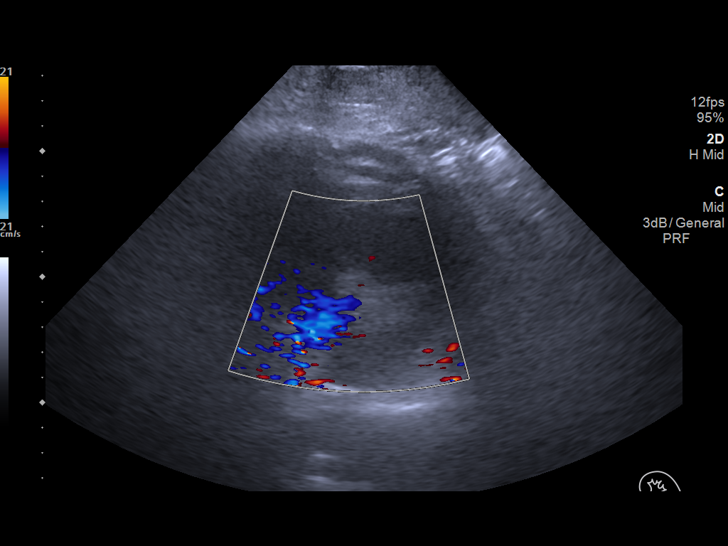
[im 81/81]
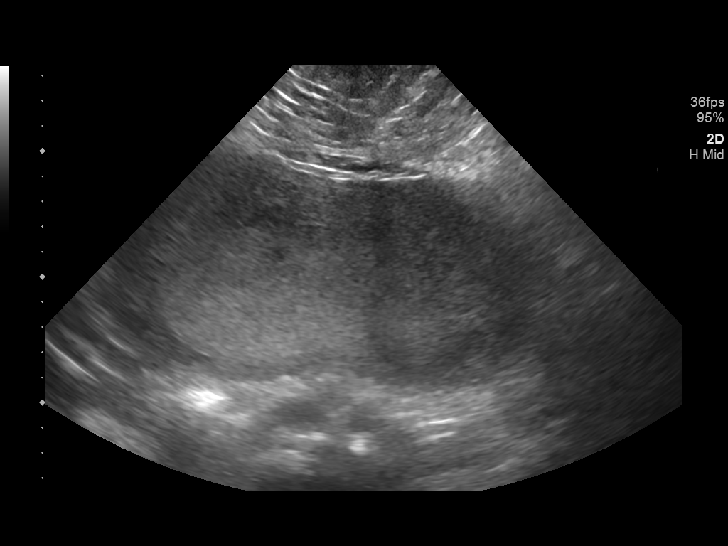

[13 of 25 positions shown; findings below may reference images not displayed]

FINDINGS: Gallbladder: Surgically absent.

Common bile duct: Diameter: 8 mm which may be within normal limits
for post cholecystectomy state. No intrahepatic biliary duct
dilatation. No mass or calculus evident in the biliary ductal
system.

Liver: No focal lesion identified. Liver echogenicity is overall
increased.. Portal vein is patent on color Doppler imaging with
normal direction of blood flow towards the liver.

IVC: No abnormality visualized.

Pancreas: No pancreatic mass or inflammatory focus.

Spleen: Size and appearance within normal limits.

Right Kidney: Length: 13.4 cm. Echogenicity within normal limits. No
mass or hydronephrosis visualized.

Left Kidney: Length: 13.2 cm. Echogenicity within normal limits. No
mass or hydronephrosis visualized.

Abdominal aorta: No aneurysm visualized.

Other findings: No demonstrable ascites.
IMPRESSION: 1. Increase in liver echogenicity, a finding felt to be indicative
of a degree of hepatic steatosis. While no focal liver lesions are
evident on this study, it must be cautioned that the sensitivity of
ultrasound for detection of focal liver lesions is diminished in
this circumstance.

2.  Gallbladder absent.

3. Common bile duct measures 8 mm which may be within normal limits
for post cholecystectomy state.

4.  Study otherwise unremarkable.

## 2019-06-17 ENCOUNTER — Ambulatory Visit: Payer: Self-pay | Admitting: Surgery

## 2019-06-17 ENCOUNTER — Encounter: Payer: Self-pay | Admitting: Surgery

## 2019-06-17 DIAGNOSIS — K644 Residual hemorrhoidal skin tags: Secondary | ICD-10-CM | POA: Insufficient documentation

## 2019-06-17 DIAGNOSIS — K648 Other hemorrhoids: Secondary | ICD-10-CM | POA: Insufficient documentation

## 2019-06-17 NOTE — H&P (Signed)
Dorien Chihuahua Documented: 06/17/2019 10:31 AM Location: [facility] Patient #: [id number] DOB: May 10, 1981 Married / Language: English / Race: Black or African American Female  History of Present Illness Adin Hector MD; 06/17/2019 11:01 AM) The patient is a 38 year old female who presents with hemorrhoids. Note for "Hemorrhoids": ` ` `   Patient returns  Seen 2017 for surgical consultation Dr. Dalene Carrow gastroenterology. Concern for hemorrhoids. I had recommended surgery to remove her very large external hemorrhoids. That did not happen.  Pleasant female. Hemorrhoid issues since her first pregnancy 2012. Had more intense episode of pain except significant rectal bleeding that scared her in 2017. Persisted over a month. Colonoscopy done. No intraluminal pathology. Internal prolapsing & large external hemorrhoids noted. Presumed etiology of the bleeding. Surgical consultation requested to see if benefit from surgery. I saw her in 2017. Obvious large external hemorrhoids. I recommended surgery. She recalls thinking that banding was still an option. Surgery to happen.  Since that time she was pregnant again. Eventually got to the pregnancy had a C-section and 2 ligation in May 2020. She notes hemorrhoids worse then. They somewhat stabilized but she still has very large outside hemorrhoids. Hard to keep clean. Usually moves her bowels once or twice a day without a stool softener. Not having constipation issues. Usually Hoppes in the shower does gentle washings to help keep the area clean. No major bleeding. However every few months she will get a hemorrhoid flare that can be painful and irritating. She met her deductible, she was hoping to see if something could be done before the end of the year. Came in in the hopes of banding being an option.  She can walk without difficulty. No cardiopulmonary issues. Some question of borderline diabetes. No  personal nor family history of GI/colon cancer, inflammatory bowel disease, irritable bowel syndrome, allergy such as Celiac Sprue, dietary/dairy problems, colitis, ulcers nor gastritis. No recent sick contacts/gastroenteritis. No travel outside the country. No changes in diet. No dysphagia to solids or liquids. No significant heartburn or reflux. No hematochezia, hematemesis, coffee ground emesis. No evidence of prior gastric/peptic ulceration.  (Review of systems as stated in this history (HPI) or in the review of systems. Otherwise all other 12 point ROS are negative) ` ` `   Past Surgical History (Tanisha A. Owens Shark, [place]; 06/17/2019 10:31 AM) Cesarean Section - Multiple Gallbladder Surgery - Laparoscopic  Diagnostic Studies History (Tanisha A. Owens Shark, [place]; 06/17/2019 10:31 AM) Colonoscopy 1-5 years ago within last year Mammogram never Pap Smear 1-5 years ago  Allergies (Tanisha A. Owens Shark, [place]; 06/17/2019 10:33 AM) Bactrim *ANTI-INFECTIVE AGENTS - MISC.* Allergies Reconciled  Medication History (Tanisha A. Owens Shark, [place]; 06/17/2019 10:33 AM) Prenatal (Oral) Active. Medications Reconciled  Social History (Tanisha A. Owens Shark, [place]; 06/17/2019 10:31 AM) Alcohol use Occasional alcohol use. Caffeine use Carbonated beverages, Coffee, Tea. No drug use Tobacco use Never smoker.  Family History (Tanisha A. Owens Shark, [place]; 06/17/2019 10:31 AM) Diabetes Mellitus Father. Hypertension Father.  Pregnancy / Birth History (Tanisha A. Owens Shark, RMA; 06/17/2019 10:31 AM) Age at menarche 78 years. Gravida 4 2 Length (months) of breastfeeding 7-12 12-24 Maternal age 53-35 Para 3 2  Other Problems (Tanisha A. Owens Shark, [place]; 06/17/2019 10:31 AM) No pertinent past medical history     Review of Systems Adin Hector MD; 06/17/2019 10:37 AM) General Not Present- Appetite Loss, Chills, Fatigue, Fever, Night Sweats, Weight Gain and Weight Loss. Skin Not Present- Change in  Wart/Mole, Dryness, Hives, Jaundice, New Lesions, Non-Healing  Wounds, Rash and Ulcer. HEENT Present- Wears glasses/contact lenses. Not Present- Earache, Hearing Loss, Hoarseness, Nose Bleed, Oral Ulcers, Ringing in the Ears, Seasonal Allergies, Sinus Pain, Sore Throat, Visual Disturbances and Yellow Eyes. Respiratory Present- Snoring. Not Present- Bloody sputum, Chronic Cough, Difficulty Breathing and Wheezing. Breast Not Present- Breast Mass, Breast Pain, Nipple Discharge and Skin Changes. Cardiovascular Not Present- Chest Pain, Difficulty Breathing Lying Down, Leg Cramps, Palpitations, Rapid Heart Rate, Shortness of Breath and Swelling of Extremities. Gastrointestinal Present- Hemorrhoids. Not Present- Abdominal Pain, Bloating, Bloody Stool, Change in Bowel Habits, Chronic diarrhea, Constipation, Difficulty Swallowing, Excessive gas, Gets full quickly at meals, Indigestion, Nausea, Rectal Pain and Vomiting. Female Genitourinary Not Present- Frequency, Nocturia, Painful Urination, Pelvic Pain and Urgency. Musculoskeletal Not Present- Back Pain, Joint Pain, Joint Stiffness, Muscle Pain, Muscle Weakness and Swelling of Extremities. Neurological Not Present- Decreased Memory, Fainting, Headaches, Numbness, Seizures, Tingling, Tremor, Trouble walking and Weakness. Psychiatric Not Present- Anxiety, Bipolar, Change in Sleep Pattern, Depression, Fearful and Frequent crying. Endocrine Not Present- Cold Intolerance, Excessive Hunger, Hair Changes, Heat Intolerance, Hot flashes and New Diabetes. Hematology Not Present- Blood Thinners, Easy Bruising, Excessive bleeding, Gland problems, HIV and Persistent Infections.  Vitals (Tanisha A. Brown RMA; 06/17/2019 10:33 AM) 06/17/2019 10:32 AM Weight: 370.8 lb Height: 64in Body Surface Area: 2.54 m Body Mass Index: 63.65 kg/m  Temp.: 97.51F  Pulse: 105 (Regular)  BP: 132/86 (Sitting, Left Arm, Standard)        Physical Exam Adin Hector  MD; 06/17/2019 10:47 AM)  General Mental Status-Alert. General Appearance-Not in acute distress, Not Sickly. Orientation-Oriented X3. Hydration-Well hydrated. Voice-Normal.  Integumentary Global Assessment Upon inspection and palpation of skin surfaces of the - Axillae: non-tender, no inflammation or ulceration, no drainage. and Distribution of scalp and body hair is normal. General Characteristics Temperature - normal warmth is noted.  Head and Neck Head-normocephalic, atraumatic with no lesions or palpable masses. Face Global Assessment - atraumatic, no absence of expression. Neck Global Assessment - no abnormal movements, no bruit auscultated on the right, no bruit auscultated on the left, no decreased range of motion, non-tender. Trachea-midline. Thyroid Gland Characteristics - non-tender.  Eye Eyeball - Left-Extraocular movements intact, No Nystagmus - Left. Eyeball - Right-Extraocular movements intact, No Nystagmus - Right. Cornea - Left-No Hazy - Left. Cornea - Right-No Hazy - Right. Sclera/Conjunctiva - Left-No scleral icterus, No Discharge - Left. Sclera/Conjunctiva - Right-No scleral icterus, No Discharge - Right. Pupil - Left-Direct reaction to light normal. Pupil - Right-Direct reaction to light normal.  ENMT Ears Pinna - Left - no drainage observed, no generalized tenderness observed. Pinna - Right - no drainage observed, no generalized tenderness observed. Nose and Sinuses External Inspection of the Nose - no destructive lesion observed. Inspection of the nares - Left - quiet respiration. Inspection of the nares - Right - quiet respiration. Mouth and Throat Lips - Upper Lip - no fissures observed, no pallor noted. Lower Lip - no fissures observed, no pallor noted. Nasopharynx - no discharge present. Oral Cavity/Oropharynx - Tongue - no dryness observed. Oral Mucosa - no cyanosis observed. Hypopharynx - no evidence of airway distress  observed.  Chest and Lung Exam Inspection Movements - Normal and Symmetrical. Accessory muscles - No use of accessory muscles in breathing. Palpation Palpation of the chest reveals - Non-tender. Auscultation Breath sounds - Normal and Clear.  Cardiovascular Auscultation Rhythm - Regular. Murmurs & Other Heart Sounds - Auscultation of the heart reveals - No Murmurs and No Systolic Clicks.  Abdomen  Inspection Inspection of the abdomen reveals - No Visible peristalsis and No Abnormal pulsations. Umbilicus - No Bleeding, No Urine drainage. Palpation/Percussion Palpation and Percussion of the abdomen reveal - Soft, Non Tender, No Rebound tenderness, No Rigidity (guarding) and No Cutaneous hyperesthesia. Note: Abdomen soft. Nontender, nondistended. No guarding. No umbilical no other hernias. Morbidly obese. Moderate size panniculus but clean with good hygiene  Female Genitourinary Note: Nontender. No inguinal hernias. No major vaginal bleeding nor foul discharge  Rectal Note: Very large external hemorrhoids., up to 6cm long.   Exam done with assistance of female Medical Assistant in the room. Perianal skin clean with good hygiene. No pruritis ani. No pilonidal disease. No fissure. No abscess/fistula. Normal sphincter tone. I held off on repeating anoscopy given anoscopy and colonoscopy 3 years ago  Peripheral Vascular Upper Extremity Inspection - Left - No Cyanotic nailbeds - Left, Not Ischemic. Inspection - Right - No Cyanotic nailbeds - Right, Not Ischemic.  Neurologic Neurologic evaluation reveals -normal attention span and ability to concentrate, able to name objects and repeat phrases. Appropriate fund of knowledge , normal sensation and normal coordination. Mental Status Affect - not angry, not paranoid. Cranial Nerves-Normal Bilaterally. Gait-Normal.  Neuropsychiatric Mental status exam performed with findings of-able to articulate well with normal  speech/language, rate, volume and coherence, thought content normal with ability to perform basic computations and apply abstract reasoning and no evidence of hallucinations, delusions, obsessions or homicidal/suicidal ideation.  Musculoskeletal Global Assessment Spine, Ribs and Pelvis - no instability, subluxation or laxity. Right Upper Extremity - no instability, subluxation or laxity.  Lymphatic Head & Neck General Head & Neck Lymphatics: Bilateral - Description - No Localized lymphadenopathy. Axillary General Axillary Region: Bilateral - Description - No Localized lymphadenopathy. Femoral & Inguinal Generalized Femoral & Inguinal Lymphatics: Left - Description - No Localized lymphadenopathy. Right - Description - No Localized lymphadenopathy.    Assessment & Plan Adin Hector MD; 06/17/2019 11:00 AM)  EXTERNAL HEMORRHOIDS WITH COMPLICATION (R10.2) Impression: Very large external hemorrhoids with internal prolapsing component as well.  I again recommended surgery. Examination under anesthesia. Internal hemorrhoidal ligation and pexy with excision of the large hemorrhoids. Most likely have to do an lithotomy given her super morbid obesity.  She is very interested in proceeding. She is hoping to have it done by the end of this year. Noted we are overbooked and not is not too likely with only 2 weeks left in the middle of a 3rd spike of the Covid pandemic. We will begin the scheduling process and see what options are available. We will coordinate convenient time.  The anatomy & physiology of the anorectal region was discussed. The pathophysiology of hemorrhoids and differential diagnosis was discussed. Natural history progression was discussed. I stressed the importance of a bowel regimen to have daily soft bowel movements to minimize progression of disease. Goal of one BM / day ideal. Use of wet wipes, warm baths, avoiding straining, etc were emphasized.  Educational handouts further  explaining the pathology, treatment options, and bowel regimen were given as well. The patient expressed understanding. She is open to considering surgery  Current Plans You are being scheduled for surgery- Our schedulers will call you.  You should hear from our office's scheduling department within 5 working days about the location, date, and time of surgery. We try to make accommodations for patient's preferences in scheduling surgery, but sometimes the OR schedule or the surgeon's schedule prevents [place] from making those accommodations.  If you have not heard  from our office [phone]) in 5 working days, call the office and ask for your surgeon's nurse.  If you have other questions about your diagnosis, plan, or surgery, call the office and ask for your surgeon's nurse.  Pt Education - [facility] Hemorrhoids (Kaavya Puskarich): discussed with patient and provided information.  PROLAPSED INTERNAL HEMORRHOIDS, GRADE 3 (K64.2) Impression: Prolapsing internal hemorrhoids. Would benefit from internal ligation and some resection with the part that prolapses with the large external piles   ENCOUNTER FOR PREOPERATIVE EXAMINATION FOR GENERAL SURGICAL PROCEDURE (Z01.818)  Current Plans Pt Education - [facility] Rectal Prep for Anorectal outpatient/office surgery: discussed with patient and provided information. Pt Education - [facility] Rectal Surgery HCI (Eliyanna Ault): discussed with patient and provided information.  MORBID OBESITY WITH BMI OF 60.0-69.9, ADULT (E66.01)  Adin Hector, MD, FACS, MASCRS Gastrointestinal and Minimally Invasive Surgery  [facility] [address], [place], [place] [address] [phone] Main / Paging [phone] Fax

## 2019-12-31 NOTE — Progress Notes (Addendum)
Reviewed pt chart pre-op interview.  Noted pt was seen in doctor office 11-08-2019(in epic [facility])  and weight was  354 lb at 5'4" and BMI 61.45.  Per anesthesia guidelines for ambulatory surgery center pt is not a candidate when BMI greater that 50. Pt will need to be rescheduled at main OR.  Called and spoke w/ Dr Michaell Cowing office triage nurse, Marcelino Duster, informed her pt will need to be done in main OR due to her BMI greater than 50.

## 2020-01-06 ENCOUNTER — Other Ambulatory Visit (HOSPITAL_COMMUNITY)
Admission: RE | Admit: 2020-01-06 | Discharge: 2020-01-06 | Disposition: A | Discharge status: Home / Self Care | Payer: 59 | Source: Ambulatory Visit | Attending: Surgery | Admitting: Surgery

## 2020-01-06 DIAGNOSIS — Z20822 Contact with and (suspected) exposure to covid-19: Secondary | ICD-10-CM | POA: Diagnosis not present

## 2020-01-06 DIAGNOSIS — Z01812 Encounter for preprocedural laboratory examination: Secondary | ICD-10-CM | POA: Insufficient documentation

## 2020-01-06 LAB — SARS CORONAVIRUS 2 (TAT 6-24 HRS): SARS Coronavirus 2: NEGATIVE

## 2020-01-07 ENCOUNTER — Encounter (HOSPITAL_COMMUNITY): Payer: Self-pay | Admitting: Surgery

## 2020-01-07 ENCOUNTER — Other Ambulatory Visit: Payer: Self-pay

## 2020-01-07 ENCOUNTER — Ambulatory Visit: Payer: Self-pay | Admitting: Surgery

## 2020-01-07 NOTE — Progress Notes (Signed)
Patient with past hx of hypertension at time of post Csection 11/2018.  Patient reports she was only blood pressure for 2 weeks post Csectionl  Does not have PCP.  Requested records post Csection from office of DR Cherly Hensen to note what blood pressure readins have been post Csection.  Also reported to [facility] the above.  Also noted office visit in epic of 11/08/2019 blood pressure reading of 150/94 and reported.  Ordered cbc, serum preg and ekg for day of surgery per Sheilah Mins.

## 2020-01-07 NOTE — H&P (Signed)
Kayla [name] DOB: 02-Sep-1980 Married / Language: English / Race: Black or African American Female  History of Present Illness Kayla Hector MD; 06/17/2019 11:01 AM) The patient is a 39 year old female who presents with hemorrhoids. Note for "Hemorrhoids": ` ` ` Patient Care Team: Patient, No Pcp Per as PCP - General (General Practice) Michael Boston, MD as Consulting Physician (General Surgery) Milus Banister, MD as Attending Physician (Gastroenterology) Servando Salina, MD as Consulting Physician (Obstetrics and Gynecology)    Patient returns  Seen 2017 for surgical consultation Dr. Dalene Carrow gastroenterology. Concern for hemorrhoids. I had recommended surgery to remove her very large external hemorrhoids. That did not happen.  Pleasant female. Hemorrhoid issues since her first pregnancy 2012. Had more intense episode of pain except significant rectal bleeding that scared her in 2017. Persisted over a month. Colonoscopy done. No intraluminal pathology. Internal prolapsing & large external hemorrhoids noted. Presumed etiology of the bleeding. Surgical consultation requested to see if benefit from surgery. I saw her in 2017. Obvious large external hemorrhoids. I recommended surgery. She recalls thinking that banding was still an option. Surgery to happen.  Since that time she was pregnant again. Eventually got to the pregnancy had a C-section and 2 ligation in May 2020. She notes hemorrhoids worse then. They somewhat stabilized but she still has very large outside hemorrhoids. Hard to keep clean. Usually moves her bowels once or twice a day without a stool softener. Not having constipation issues. Usually Hoppes in the shower does gentle washings to help keep the area clean. No major bleeding. However every few months she will get a hemorrhoid flare that can be painful and irritating. She met her deductible, she was hoping to see if something  could be done before the end of the year. Came in in the hopes of banding being an option.  She can walk without difficulty. No cardiopulmonary issues. Some question of borderline diabetes. No personal nor family history of GI/colon cancer, inflammatory bowel disease, irritable bowel syndrome, allergy such as Celiac Sprue, dietary/dairy problems, colitis, ulcers nor gastritis. No recent sick contacts/gastroenteritis. No travel outside the country. No changes in diet. No dysphagia to solids or liquids. No significant heartburn or reflux. No hematochezia, hematemesis, coffee ground emesis. No evidence of prior gastric/peptic ulceration.  (Review of systems as stated in this history (HPI) or in the review of systems. Otherwise all other 12 point ROS are negative) ` ` `   Past Surgical History (Tanisha A. Owens Shark, [place]; 06/17/2019 10:31 AM) Cesarean Section - Multiple Gallbladder Surgery - Laparoscopic  Diagnostic Studies History (Tanisha A. Owens Shark, [place]; 06/17/2019 10:31 AM) Colonoscopy 1-5 years ago within last year Mammogram never Pap Smear 1-5 years ago  Allergies (Tanisha A. Owens Shark, [place]; 06/17/2019 10:33 AM) Bactrim *ANTI-INFECTIVE AGENTS - MISC.* Allergies Reconciled  Medication History (Tanisha A. Owens Shark, [place]; 06/17/2019 10:33 AM) Prenatal (Oral) Active. Medications Reconciled  Social History (Tanisha A. Owens Shark, [place]; 06/17/2019 10:31 AM) Alcohol use Occasional alcohol use. Caffeine use Carbonated beverages, Coffee, Tea. No drug use Tobacco use Never smoker.  Family History (Tanisha A. Owens Shark, [place]; 06/17/2019 10:31 AM) Diabetes Mellitus Father. Hypertension Father.  Pregnancy / Birth History (Tanisha A. Owens Shark, RMA; 06/17/2019 10:31 AM) Age at menarche 39 years. Gravida 4 2 Length (months) of breastfeeding 7-12 12-24 Maternal age 13-35 Para 3 2  Other Problems (Tanisha A. Owens Shark, [place]; 06/17/2019 10:31 AM) No pertinent past medical  history     Review of Systems Kayla Hector MD; 06/17/2019 10:37  AM) General Not Present- Appetite Loss, Chills, Fatigue, Fever, Night Sweats, Weight Gain and Weight Loss. Skin Not Present- Change in Wart/Mole, Dryness, Hives, Jaundice, New Lesions, Non-Healing Wounds, Rash and Ulcer. HEENT Present- Wears glasses/contact lenses. Not Present- Earache, Hearing Loss, Hoarseness, Nose Bleed, Oral Ulcers, Ringing in the Ears, Seasonal Allergies, Sinus Pain, Sore Throat, Visual Disturbances and Yellow Eyes. Respiratory Present- Snoring. Not Present- Bloody sputum, Chronic Cough, Difficulty Breathing and Wheezing. Breast Not Present- Breast Mass, Breast Pain, Nipple Discharge and Skin Changes. Cardiovascular Not Present- Chest Pain, Difficulty Breathing Lying Down, Leg Cramps, Palpitations, Rapid Heart Rate, Shortness of Breath and Swelling of Extremities. Gastrointestinal Present- Hemorrhoids. Not Present- Abdominal Pain, Bloating, Bloody Stool, Change in Bowel Habits, Chronic diarrhea, Constipation, Difficulty Swallowing, Excessive gas, Gets full quickly at meals, Indigestion, Nausea, Rectal Pain and Vomiting. Female Genitourinary Not Present- Frequency, Nocturia, Painful Urination, Pelvic Pain and Urgency. Musculoskeletal Not Present- Back Pain, Joint Pain, Joint Stiffness, Muscle Pain, Muscle Weakness and Swelling of Extremities. Neurological Not Present- Decreased Memory, Fainting, Headaches, Numbness, Seizures, Tingling, Tremor, Trouble walking and Weakness. Psychiatric Not Present- Anxiety, Bipolar, Change in Sleep Pattern, Depression, Fearful and Frequent crying. Endocrine Not Present- Cold Intolerance, Excessive Hunger, Hair Changes, Heat Intolerance, Hot flashes and New Diabetes. Hematology Not Present- Blood Thinners, Easy Bruising, Excessive bleeding, Gland problems, HIV and Persistent Infections.  Vitals (Tanisha A. Brown RMA; 06/17/2019 10:33 AM) 06/17/2019 10:32 AM Weight:  370.8 lb Height: 64in Body Surface Area: 2.54 m Body Mass Index: 63.65 kg/m  Temp.: 97.46F  Pulse: 105 (Regular)  BP: 132/86 (Sitting, Left Arm, Standard)        Physical Exam Kayla Hector MD; 06/17/2019 10:47 AM)  General Mental Status-Alert. General Appearance-Not in acute distress, Not Sickly. Orientation-Oriented X3. Hydration-Well hydrated. Voice-Normal.  Integumentary Global Assessment Upon inspection and palpation of skin surfaces of the - Axillae: non-tender, no inflammation or ulceration, no drainage. and Distribution of scalp and body hair is normal. General Characteristics Temperature - normal warmth is noted.  Head and Neck Head-normocephalic, atraumatic with no lesions or palpable masses. Face Global Assessment - atraumatic, no absence of expression. Neck Global Assessment - no abnormal movements, no bruit auscultated on the right, no bruit auscultated on the left, no decreased range of motion, non-tender. Trachea-midline. Thyroid Gland Characteristics - non-tender.  Eye Eyeball - Left-Extraocular movements intact, No Nystagmus - Left. Eyeball - Right-Extraocular movements intact, No Nystagmus - Right. Cornea - Left-No Hazy - Left. Cornea - Right-No Hazy - Right. Sclera/Conjunctiva - Left-No scleral icterus, No Discharge - Left. Sclera/Conjunctiva - Right-No scleral icterus, No Discharge - Right. Pupil - Left-Direct reaction to light normal. Pupil - Right-Direct reaction to light normal.  ENMT Ears Pinna - Left - no drainage observed, no generalized tenderness observed. Pinna - Right - no drainage observed, no generalized tenderness observed. Nose and Sinuses External Inspection of the Nose - no destructive lesion observed. Inspection of the nares - Left - quiet respiration. Inspection of the nares - Right - quiet respiration. Mouth and Throat Lips - Upper Lip - no fissures observed, no pallor  noted. Lower Lip - no fissures observed, no pallor noted. Nasopharynx - no discharge present. Oral Cavity/Oropharynx - Tongue - no dryness observed. Oral Mucosa - no cyanosis observed. Hypopharynx - no evidence of airway distress observed.  Chest and Lung Exam Inspection Movements - Normal and Symmetrical. Accessory muscles - No use of accessory muscles in breathing. Palpation Palpation of the chest reveals - Non-tender. Auscultation Breath sounds - Normal  and Clear.  Cardiovascular Auscultation Rhythm - Regular. Murmurs & Other Heart Sounds - Auscultation of the heart reveals - No Murmurs and No Systolic Clicks.  Abdomen Inspection Inspection of the abdomen reveals - No Visible peristalsis and No Abnormal pulsations. Umbilicus - No Bleeding, No Urine drainage. Palpation/Percussion Palpation and Percussion of the abdomen reveal - Soft, Non Tender, No Rebound tenderness, No Rigidity (guarding) and No Cutaneous hyperesthesia. Note: Abdomen soft. Nontender, nondistended. No guarding. No umbilical no other hernias. Morbidly obese. Moderate size panniculus but clean with good hygiene  Female Genitourinary Note: Nontender. No inguinal hernias. No major vaginal bleeding nor foul discharge  Rectal Note: Very large external hemorrhoids., up to 6cm long.   Exam done with assistance of female Medical Assistant in the room. Perianal skin clean with good hygiene. No pruritis ani. No pilonidal disease. No fissure. No abscess/fistula. Normal sphincter tone. I held off on repeating anoscopy given anoscopy and colonoscopy 3 years ago  Peripheral Vascular Upper Extremity Inspection - Left - No Cyanotic nailbeds - Left, Not Ischemic. Inspection - Right - No Cyanotic nailbeds - Right, Not Ischemic.  Neurologic Neurologic evaluation reveals -normal attention span and ability to concentrate, able to name objects and repeat phrases. Appropriate fund of knowledge , normal  sensation and normal coordination. Mental Status Affect - not angry, not paranoid. Cranial Nerves-Normal Bilaterally. Gait-Normal.  Neuropsychiatric Mental status exam performed with findings of-able to articulate well with normal speech/language, rate, volume and coherence, thought content normal with ability to perform basic computations and apply abstract reasoning and no evidence of hallucinations, delusions, obsessions or homicidal/suicidal ideation.  Musculoskeletal Global Assessment Spine, Ribs and Pelvis - no instability, subluxation or laxity. Right Upper Extremity - no instability, subluxation or laxity.  Lymphatic Head & Neck General Head & Neck Lymphatics: Bilateral - Description - No Localized lymphadenopathy. Axillary General Axillary Region: Bilateral - Description - No Localized lymphadenopathy. Femoral & Inguinal Generalized Femoral & Inguinal Lymphatics: Left - Description - No Localized lymphadenopathy. Right - Description - No Localized lymphadenopathy.    Assessment & Plan   EXTERNAL HEMORRHOIDS WITH COMPLICATION (O53.6) Impression: Very large external hemorrhoids with internal prolapsing component as well.  I again recommended surgery. Examination under anesthesia. Internal hemorrhoidal ligation and pexy with excision of the large hemorrhoids. Most likely have to do an lithotomy given her super morbid obesity.  She is very interested in proceeding. She is hoping to have it done by the end of this year. Noted we are overbooked and not is not too likely with only 2 weeks left in the middle of a 3rd spike of the Covid pandemic. We will begin the scheduling process and see what options are available. We will coordinate convenient time.  The anatomy & physiology of the anorectal region was discussed. The pathophysiology of hemorrhoids and differential diagnosis was discussed. Natural history progression was discussed. I stressed the importance of a  bowel regimen to have daily soft bowel movements to minimize progression of disease. Goal of one BM / day ideal. Use of wet wipes, warm baths, avoiding straining, etc were emphasized.  Educational handouts further explaining the pathology, treatment options, and bowel regimen were given as well. The patient expressed understanding. She is ready for surgery    PROLAPSED INTERNAL HEMORRHOIDS, GRADE 3 (K64.2) Impression: Prolapsing internal hemorrhoids. Would benefit from internal ligation and some resection with the part that prolapses with the large external piles   ENCOUNTER FOR PREOPERATIVE EXAMINATION FOR GENERAL SURGICAL PROCEDURE (Z01.818)  Current Plans Pt  Education - [facility] Rectal Prep for Anorectal outpatient/office surgery: discussed with patient and provided information. Pt Education - [facility] Rectal Surgery HCI (Kushi Kun): discussed with patient and provided information.  MORBID OBESITY WITH BMI OF 60.0-69.9, ADULT (E66.01)  Kayla Hector, MD, FACS, MASCRS Gastrointestinal and Minimally Invasive Surgery  [facility] [address] [place],  [address] [phone] Main / Paging [phone] Fax

## 2020-01-07 NOTE — Progress Notes (Signed)
Need orders in epic.  Surgery on 01/09/20.  Thank You.

## 2020-01-07 NOTE — H&P (View-Only) (Signed)
Kayla [name] DOB: [birth year] Married / Language: English / Race: Black or African American Female  History of Present Illness Kayla Sportsman MD; 06/17/2019 11:01 AM) The patient is a 39 year old female who presents with hemorrhoids. Note for "Hemorrhoids": ` ` ` Patient Care Team: Patient, No Pcp Per as PCP - General (General Practice) Karie Soda, MD as Consulting Physician (General Surgery) Rachael Fee, MD as Attending Physician (Gastroenterology) Maxie Better, MD as Consulting Physician (Obstetrics and Gynecology)    Patient returns  Seen 2017 for surgical consultation Dr. Drue Flirt gastroenterology. Concern for hemorrhoids. I had recommended surgery to remove her very large external hemorrhoids. That did not happen.  Pleasant female. Hemorrhoid issues since her first pregnancy 2012. Had more intense episode of pain except significant rectal bleeding that scared her in 2017. Persisted over a month. Colonoscopy done. No intraluminal pathology. Internal prolapsing & large external hemorrhoids noted. Presumed etiology of the bleeding. Surgical consultation requested to see if benefit from surgery. I saw her in 2017. Obvious large external hemorrhoids. I recommended surgery. She recalls thinking that banding was still an option. Surgery to happen.  Since that time she was pregnant again. Eventually got to the pregnancy had a C-section and 2 ligation in May 2020. She notes hemorrhoids worse then. They somewhat stabilized but she still has very large outside hemorrhoids. Hard to keep clean. Usually moves her bowels once or twice a day without a stool softener. Not having constipation issues. Usually Hoppes in the shower does gentle washings to help keep the area clean. No major bleeding. However every few months she will get a hemorrhoid flare that can be painful and irritating. She met her deductible, she was hoping to see if something  could be done before the end of the year. Came in in the hopes of banding being an option.  She can walk without difficulty. No cardiopulmonary issues. Some question of borderline diabetes. No personal nor family history of GI/colon cancer, inflammatory bowel disease, irritable bowel syndrome, allergy such as Celiac Sprue, dietary/dairy problems, colitis, ulcers nor gastritis. No recent sick contacts/gastroenteritis. No travel outside the country. No changes in diet. No dysphagia to solids or liquids. No significant heartburn or reflux. No hematochezia, hematemesis, coffee ground emesis. No evidence of prior gastric/peptic ulceration.  (Review of systems as stated in this history (HPI) or in the review of systems. Otherwise all other 12 point ROS are negative) ` ` `   Past Surgical History (Tanisha A. Manson Passey, RMA; 06/17/2019 10:31 AM) Cesarean Section - Multiple Gallbladder Surgery - Laparoscopic  Diagnostic Studies History (Tanisha A. Manson Passey, RMA; 06/17/2019 10:31 AM) Colonoscopy 1-5 years ago within last year Mammogram never Pap Smear 1-5 years ago  Allergies (Tanisha A. Manson Passey, RMA; 06/17/2019 10:33 AM) Bactrim *ANTI-INFECTIVE AGENTS - MISC.* Allergies Reconciled  Medication History (Tanisha A. Manson Passey, RMA; 06/17/2019 10:33 AM) Prenatal (Oral) Active. Medications Reconciled  Social History (Tanisha A. Manson Passey, RMA; 06/17/2019 10:31 AM) Alcohol use Occasional alcohol use. Caffeine use Carbonated beverages, Coffee, Tea. No drug use Tobacco use Never smoker.  Family History (Tanisha A. Manson Passey, RMA; 06/17/2019 10:31 AM) Diabetes Mellitus Father. Hypertension Father.  Pregnancy / Birth History (Tanisha A. Manson Passey, RMA; 06/17/2019 10:31 AM) Age at menarche 10 years. Gravida 4 2 Length (months) of breastfeeding 7-12 12-24 Maternal age 66-35 Para 3 2  Other Problems (Tanisha A. Manson Passey, RMA; 06/17/2019 10:31 AM) No pertinent past medical  history     Review of Systems Kayla Sportsman MD; 06/17/2019 10:37  AM) General Not Present- Appetite Loss, Chills, Fatigue, Fever, Night Sweats, Weight Gain and Weight Loss. Skin Not Present- Change in Wart/Mole, Dryness, Hives, Jaundice, New Lesions, Non-Healing Wounds, Rash and Ulcer. HEENT Present- Wears glasses/contact lenses. Not Present- Earache, Hearing Loss, Hoarseness, Nose Bleed, Oral Ulcers, Ringing in the Ears, Seasonal Allergies, Sinus Pain, Sore Throat, Visual Disturbances and Yellow Eyes. Respiratory Present- Snoring. Not Present- Bloody sputum, Chronic Cough, Difficulty Breathing and Wheezing. Breast Not Present- Breast Mass, Breast Pain, Nipple Discharge and Skin Changes. Cardiovascular Not Present- Chest Pain, Difficulty Breathing Lying Down, Leg Cramps, Palpitations, Rapid Heart Rate, Shortness of Breath and Swelling of Extremities. Gastrointestinal Present- Hemorrhoids. Not Present- Abdominal Pain, Bloating, Bloody Stool, Change in Bowel Habits, Chronic diarrhea, Constipation, Difficulty Swallowing, Excessive gas, Gets full quickly at meals, Indigestion, Nausea, Rectal Pain and Vomiting. Female Genitourinary Not Present- Frequency, Nocturia, Painful Urination, Pelvic Pain and Urgency. Musculoskeletal Not Present- Back Pain, Joint Pain, Joint Stiffness, Muscle Pain, Muscle Weakness and Swelling of Extremities. Neurological Not Present- Decreased Memory, Fainting, Headaches, Numbness, Seizures, Tingling, Tremor, Trouble walking and Weakness. Psychiatric Not Present- Anxiety, Bipolar, Change in Sleep Pattern, Depression, Fearful and Frequent crying. Endocrine Not Present- Cold Intolerance, Excessive Hunger, Hair Changes, Heat Intolerance, Hot flashes and New Diabetes. Hematology Not Present- Blood Thinners, Easy Bruising, Excessive bleeding, Gland problems, HIV and Persistent Infections.  Vitals (Tanisha A. Brown RMA; 06/17/2019 10:33 AM) 06/17/2019 10:32 AM Weight:  370.8 lb Height: 64in Body Surface Area: 2.54 m Body Mass Index: 63.65 kg/m  Temp.: 97.40F  Pulse: 105 (Regular)  BP: 132/86 (Sitting, Left Arm, Standard)        Physical Exam Adin Hector MD; 06/17/2019 10:47 AM)  General Mental Status-Alert. General Appearance-Not in acute distress, Not Sickly. Orientation-Oriented X3. Hydration-Well hydrated. Voice-Normal.  Integumentary Global Assessment Upon inspection and palpation of skin surfaces of the - Axillae: non-tender, no inflammation or ulceration, no drainage. and Distribution of scalp and body hair is normal. General Characteristics Temperature - normal warmth is noted.  Head and Neck Head-normocephalic, atraumatic with no lesions or palpable masses. Face Global Assessment - atraumatic, no absence of expression. Neck Global Assessment - no abnormal movements, no bruit auscultated on the right, no bruit auscultated on the left, no decreased range of motion, non-tender. Trachea-midline. Thyroid Gland Characteristics - non-tender.  Eye Eyeball - Left-Extraocular movements intact, No Nystagmus - Left. Eyeball - Right-Extraocular movements intact, No Nystagmus - Right. Cornea - Left-No Hazy - Left. Cornea - Right-No Hazy - Right. Sclera/Conjunctiva - Left-No scleral icterus, No Discharge - Left. Sclera/Conjunctiva - Right-No scleral icterus, No Discharge - Right. Pupil - Left-Direct reaction to light normal. Pupil - Right-Direct reaction to light normal.  ENMT Ears Pinna - Left - no drainage observed, no generalized tenderness observed. Pinna - Right - no drainage observed, no generalized tenderness observed. Nose and Sinuses External Inspection of the Nose - no destructive lesion observed. Inspection of the nares - Left - quiet respiration. Inspection of the nares - Right - quiet respiration. Mouth and Throat Lips - Upper Lip - no fissures observed, no pallor  noted. Lower Lip - no fissures observed, no pallor noted. Nasopharynx - no discharge present. Oral Cavity/Oropharynx - Tongue - no dryness observed. Oral Mucosa - no cyanosis observed. Hypopharynx - no evidence of airway distress observed.  Chest and Lung Exam Inspection Movements - Normal and Symmetrical. Accessory muscles - No use of accessory muscles in breathing. Palpation Palpation of the chest reveals - Non-tender. Auscultation Breath sounds - Normal  and Clear.  Cardiovascular Auscultation Rhythm - Regular. Murmurs & Other Heart Sounds - Auscultation of the heart reveals - No Murmurs and No Systolic Clicks.  Abdomen Inspection Inspection of the abdomen reveals - No Visible peristalsis and No Abnormal pulsations. Umbilicus - No Bleeding, No Urine drainage. Palpation/Percussion Palpation and Percussion of the abdomen reveal - Soft, Non Tender, No Rebound tenderness, No Rigidity (guarding) and No Cutaneous hyperesthesia. Note: Abdomen soft. Nontender, nondistended. No guarding. No umbilical no other hernias. Morbidly obese. Moderate size panniculus but clean with good hygiene  Female Genitourinary Note: Nontender. No inguinal hernias. No major vaginal bleeding nor foul discharge  Rectal Note: Very large external hemorrhoids., up to 6cm long.   Exam done with assistance of female Medical Assistant in the room. Perianal skin clean with good hygiene. No pruritis ani. No pilonidal disease. No fissure. No abscess/fistula. Normal sphincter tone. I held off on repeating anoscopy given anoscopy and colonoscopy 3 years ago  Peripheral Vascular Upper Extremity Inspection - Left - No Cyanotic nailbeds - Left, Not Ischemic. Inspection - Right - No Cyanotic nailbeds - Right, Not Ischemic.  Neurologic Neurologic evaluation reveals -normal attention span and ability to concentrate, able to name objects and repeat phrases. Appropriate fund of knowledge , normal  sensation and normal coordination. Mental Status Affect - not angry, not paranoid. Cranial Nerves-Normal Bilaterally. Gait-Normal.  Neuropsychiatric Mental status exam performed with findings of-able to articulate well with normal speech/language, rate, volume and coherence, thought content normal with ability to perform basic computations and apply abstract reasoning and no evidence of hallucinations, delusions, obsessions or homicidal/suicidal ideation.  Musculoskeletal Global Assessment Spine, Ribs and Pelvis - no instability, subluxation or laxity. Right Upper Extremity - no instability, subluxation or laxity.  Lymphatic Head & Neck General Head & Neck Lymphatics: Bilateral - Description - No Localized lymphadenopathy. Axillary General Axillary Region: Bilateral - Description - No Localized lymphadenopathy. Femoral & Inguinal Generalized Femoral & Inguinal Lymphatics: Left - Description - No Localized lymphadenopathy. Right - Description - No Localized lymphadenopathy.    Assessment & Plan   EXTERNAL HEMORRHOIDS WITH COMPLICATION (J81.1) Impression: Very large external hemorrhoids with internal prolapsing component as well.  I again recommended surgery. Examination under anesthesia. Internal hemorrhoidal ligation and pexy with excision of the large hemorrhoids. Most likely have to do an lithotomy given her super morbid obesity.  She is very interested in proceeding. She is hoping to have it done by the end of this year. Noted we are overbooked and not is not too likely with only 2 weeks left in the middle of a 3rd spike of the Covid pandemic. We will begin the scheduling process and see what options are available. We will coordinate convenient time.  The anatomy & physiology of the anorectal region was discussed. The pathophysiology of hemorrhoids and differential diagnosis was discussed. Natural history progression was discussed. I stressed the importance of a  bowel regimen to have daily soft bowel movements to minimize progression of disease. Goal of one BM / day ideal. Use of wet wipes, warm baths, avoiding straining, etc were emphasized.  Educational handouts further explaining the pathology, treatment options, and bowel regimen were given as well. The patient expressed understanding. She is ready for surgery    PROLAPSED INTERNAL HEMORRHOIDS, GRADE 3 (K64.2) Impression: Prolapsing internal hemorrhoids. Would benefit from internal ligation and some resection with the part that prolapses with the large external piles   ENCOUNTER FOR PREOPERATIVE EXAMINATION FOR GENERAL SURGICAL PROCEDURE (Z01.818)  Current Plans Pt  Education - [facility] Rectal Prep for Anorectal outpatient/office surgery: discussed with patient and provided information. Pt Education - [facility] Rectal Surgery HCI (Quanta Roher): discussed with patient and provided information.  MORBID OBESITY WITH BMI OF 60.0-69.9, ADULT (E66.01)  Adin Hector, MD, FACS, MASCRS Gastrointestinal and Minimally Invasive Surgery  [facility] [address] [place], [place] [address] [phone] Main / Paging [phone] Fax

## 2020-01-08 MED ORDER — BUPIVACAINE LIPOSOME 1.3 % IJ SUSP
20.0000 mL | Freq: Once | INTRAMUSCULAR | Status: DC
  Filled 2020-01-08: qty 20

## 2020-01-08 NOTE — Anesthesia Preprocedure Evaluation (Addendum)
Anesthesia Evaluation  Patient identified by MRN, date of birth, ID band Patient awake    Reviewed: Allergy & Precautions, H&P , NPO status , Patient's Chart, lab work & pertinent test results  Airway Mallampati: I  TM Distance: >3 FB Neck ROM: Full    Dental no notable dental hx. (+) Teeth Intact, Dental Advisory Given, Caps   Pulmonary neg pulmonary ROS,    Pulmonary exam normal breath sounds clear to auscultation       Cardiovascular Exercise Tolerance: Good negative cardio ROS Normal cardiovascular exam Rhythm:Regular Rate:Normal     Neuro/Psych negative neurological ROS  negative psych ROS   GI/Hepatic negative GI ROS, Neg liver ROS,   Endo/Other  negative endocrine ROSMorbid obesity  Renal/GU negative Renal ROS  negative genitourinary   Musculoskeletal negative musculoskeletal ROS (+)   Abdominal (+) + obese,   Peds negative pediatric ROS (+)  Hematology negative hematology ROS (+)   Anesthesia Other Findings   Reproductive/Obstetrics negative OB ROS                            Anesthesia Physical Anesthesia Plan  ASA: III  Anesthesia Plan: General   Post-op Pain Management:    Induction: Intravenous  PONV Risk Score and Plan: 3 and Ondansetron, Dexamethasone and Treatment may vary due to age or medical condition  Airway Management Planned: Oral ETT and LMA  Additional Equipment:   Intra-op Plan:   Post-operative Plan: Extubation in OR  Informed Consent: I have reviewed the patients History and Physical, chart, labs and discussed the procedure including the risks, benefits and alternatives for the proposed anesthesia with the patient or authorized representative who has indicated his/her understanding and acceptance.       Plan Discussed with: Anesthesiologist and CRNA  Anesthesia Plan Comments: (  )       Anesthesia Quick Evaluation

## 2020-01-09 ENCOUNTER — Ambulatory Visit (HOSPITAL_COMMUNITY): Payer: 59 | Admitting: Physician Assistant

## 2020-01-09 ENCOUNTER — Encounter (HOSPITAL_COMMUNITY)
Admission: RE | Disposition: A | Discharge status: Home / Self Care | Payer: Self-pay | Source: Home / Self Care | Attending: Surgery

## 2020-01-09 ENCOUNTER — Ambulatory Visit (HOSPITAL_COMMUNITY)
Admission: RE | Admit: 2020-01-09 | Discharge: 2020-01-09 | Disposition: A | Discharge status: Home / Self Care | Payer: 59 | Attending: Surgery | Admitting: Surgery

## 2020-01-09 ENCOUNTER — Encounter (HOSPITAL_COMMUNITY): Payer: Self-pay | Admitting: Surgery

## 2020-01-09 DIAGNOSIS — K644 Residual hemorrhoidal skin tags: Secondary | ICD-10-CM | POA: Diagnosis not present

## 2020-01-09 DIAGNOSIS — Z6841 Body Mass Index (BMI) 40.0 and over, adult: Secondary | ICD-10-CM | POA: Diagnosis not present

## 2020-01-09 DIAGNOSIS — K648 Other hemorrhoids: Secondary | ICD-10-CM | POA: Diagnosis present

## 2020-01-09 DIAGNOSIS — K642 Third degree hemorrhoids: Secondary | ICD-10-CM | POA: Diagnosis not present

## 2020-01-09 DIAGNOSIS — Z881 Allergy status to other antibiotic agents status: Secondary | ICD-10-CM | POA: Insufficient documentation

## 2020-01-09 HISTORY — PX: EVALUATION UNDER ANESTHESIA WITH HEMORRHOIDECTOMY: SHX5624

## 2020-01-09 HISTORY — DX: Essential (primary) hypertension: I10

## 2020-01-09 LAB — CBC
HCT: 41.2 % (ref 36.0–46.0)
Hemoglobin: 13.5 g/dL (ref 12.0–15.0)
MCH: 30.6 pg (ref 26.0–34.0)
MCHC: 32.8 g/dL (ref 30.0–36.0)
MCV: 93.4 fL (ref 80.0–100.0)
Platelets: 331 10*3/uL (ref 150–400)
RBC: 4.41 MIL/uL (ref 3.87–5.11)
RDW: 13 % (ref 11.5–15.5)
WBC: 4.9 10*3/uL (ref 4.0–10.5)
nRBC: 0 % (ref 0.0–0.2)

## 2020-01-09 LAB — PREGNANCY, URINE: Preg Test, Ur: NEGATIVE

## 2020-01-09 SURGERY — EXAM UNDER ANESTHESIA WITH HEMORRHOIDECTOMY
Anesthesia: General

## 2020-01-09 MED ORDER — MIDAZOLAM HCL 2 MG/2ML IJ SOLN
INTRAMUSCULAR | Status: DC | PRN
  Administered 2020-01-09: 2 mg via INTRAVENOUS

## 2020-01-09 MED ORDER — CHLORHEXIDINE GLUCONATE CLOTH 2 % EX PADS: 6.0000 | MEDICATED_PAD | Freq: Once | CUTANEOUS | Status: DC

## 2020-01-09 MED ORDER — FENTANYL CITRATE (PF) 100 MCG/2ML IJ SOLN
INTRAMUSCULAR | Status: DC | PRN
  Administered 2020-01-09: 100 ug via INTRAVENOUS
  Administered 2020-01-09: 50 ug via INTRAVENOUS

## 2020-01-09 MED ORDER — FENTANYL CITRATE (PF) 100 MCG/2ML IJ SOLN
INTRAMUSCULAR | Status: AC
  Filled 2020-01-09: qty 2

## 2020-01-09 MED ORDER — LACTATED RINGERS IV SOLN: INTRAVENOUS | Status: DC

## 2020-01-09 MED ORDER — DIBUCAINE (PERIANAL) 1 % EX OINT
TOPICAL_OINTMENT | CUTANEOUS | Status: DC | PRN
  Administered 2020-01-09: 1 via RECTAL

## 2020-01-09 MED ORDER — LIDOCAINE 2% (20 MG/ML) 5 ML SYRINGE
INTRAMUSCULAR | Status: AC
  Filled 2020-01-09: qty 5

## 2020-01-09 MED ORDER — BUPIVACAINE LIPOSOME 1.3 % IJ SUSP
INTRAMUSCULAR | Status: DC | PRN
  Administered 2020-01-09: 20 mL

## 2020-01-09 MED ORDER — CELECOXIB 200 MG PO CAPS
400.0000 mg | ORAL_CAPSULE | ORAL | Status: AC
  Administered 2020-01-09: 400 mg via ORAL
  Filled 2020-01-09: qty 2

## 2020-01-09 MED ORDER — SUGAMMADEX SODIUM 500 MG/5ML IV SOLN
INTRAVENOUS | Status: AC
  Filled 2020-01-09: qty 5

## 2020-01-09 MED ORDER — DEXAMETHASONE SODIUM PHOSPHATE 10 MG/ML IJ SOLN
INTRAMUSCULAR | Status: DC | PRN
  Administered 2020-01-09: 4 mg via INTRAVENOUS

## 2020-01-09 MED ORDER — ONDANSETRON HCL 4 MG/2ML IJ SOLN: 4.0000 mg | Freq: Once | INTRAMUSCULAR | Status: DC | PRN

## 2020-01-09 MED ORDER — ENSURE PRE-SURGERY PO LIQD
296.0000 mL | Freq: Once | ORAL | Status: DC
  Filled 2020-01-09: qty 296

## 2020-01-09 MED ORDER — FENTANYL CITRATE (PF) 100 MCG/2ML IJ SOLN
INTRAMUSCULAR | Status: AC
  Administered 2020-01-09: 50 ug via INTRAVENOUS
  Filled 2020-01-09: qty 2

## 2020-01-09 MED ORDER — ACETAMINOPHEN 160 MG/5ML PO SOLN: 325.0000 mg | ORAL | Status: DC | PRN

## 2020-01-09 MED ORDER — LIDOCAINE 2% (20 MG/ML) 5 ML SYRINGE
INTRAMUSCULAR | Status: DC | PRN
  Administered 2020-01-09: 60 mg via INTRAVENOUS

## 2020-01-09 MED ORDER — ORAL CARE MOUTH RINSE: 15.0000 mL | Freq: Once | OROMUCOSAL | Status: AC

## 2020-01-09 MED ORDER — ROCURONIUM BROMIDE 10 MG/ML (PF) SYRINGE
PREFILLED_SYRINGE | INTRAVENOUS | Status: DC | PRN
  Administered 2020-01-09: 50 mg via INTRAVENOUS

## 2020-01-09 MED ORDER — PROPOFOL 10 MG/ML IV BOLUS
INTRAVENOUS | Status: AC
  Filled 2020-01-09: qty 20

## 2020-01-09 MED ORDER — PROPOFOL 10 MG/ML IV BOLUS
INTRAVENOUS | Status: DC | PRN
  Administered 2020-01-09: 200 mg via INTRAVENOUS

## 2020-01-09 MED ORDER — DIBUCAINE (PERIANAL) 1 % EX OINT
TOPICAL_OINTMENT | CUTANEOUS | Status: AC
  Filled 2020-01-09: qty 28

## 2020-01-09 MED ORDER — BUPIVACAINE-EPINEPHRINE 0.5% -1:200000 IJ SOLN
INTRAMUSCULAR | Status: DC | PRN
  Administered 2020-01-09: 30 mL

## 2020-01-09 MED ORDER — OXYCODONE HCL 5 MG/5ML PO SOLN: 5.0000 mg | Freq: Once | ORAL | Status: AC | PRN

## 2020-01-09 MED ORDER — BUPIVACAINE-EPINEPHRINE (PF) 0.25% -1:200000 IJ SOLN
INTRAMUSCULAR | Status: AC
  Filled 2020-01-09: qty 30

## 2020-01-09 MED ORDER — OXYCODONE HCL 5 MG PO TABS: 5.0000 mg | ORAL_TABLET | Freq: Four times a day (QID) | ORAL | 0 refills | Status: AC | PRN

## 2020-01-09 MED ORDER — GABAPENTIN 300 MG PO CAPS
300.0000 mg | ORAL_CAPSULE | ORAL | Status: AC
  Administered 2020-01-09: 300 mg via ORAL
  Filled 2020-01-09: qty 1

## 2020-01-09 MED ORDER — METRONIDAZOLE IN NACL 5-0.79 MG/ML-% IV SOLN
500.0000 mg | INTRAVENOUS | Status: AC
  Administered 2020-01-09: 500 mg via INTRAVENOUS
  Filled 2020-01-09: qty 100

## 2020-01-09 MED ORDER — OXYCODONE HCL 5 MG PO TABS
ORAL_TABLET | ORAL | Status: AC
  Administered 2020-01-09: 5 mg via ORAL
  Filled 2020-01-09: qty 1

## 2020-01-09 MED ORDER — ONDANSETRON HCL 4 MG/2ML IJ SOLN
INTRAMUSCULAR | Status: AC
  Filled 2020-01-09: qty 2

## 2020-01-09 MED ORDER — FENTANYL CITRATE (PF) 100 MCG/2ML IJ SOLN
25.0000 ug | INTRAMUSCULAR | Status: DC | PRN
  Administered 2020-01-09: 50 ug via INTRAVENOUS

## 2020-01-09 MED ORDER — ACETAMINOPHEN 500 MG PO TABS
1000.0000 mg | ORAL_TABLET | ORAL | Status: AC
  Administered 2020-01-09: 1000 mg via ORAL
  Filled 2020-01-09: qty 2

## 2020-01-09 MED ORDER — SUCCINYLCHOLINE CHLORIDE 200 MG/10ML IV SOSY
PREFILLED_SYRINGE | INTRAVENOUS | Status: AC
  Filled 2020-01-09: qty 10

## 2020-01-09 MED ORDER — MIDAZOLAM HCL 2 MG/2ML IJ SOLN
INTRAMUSCULAR | Status: AC
  Filled 2020-01-09: qty 2

## 2020-01-09 MED ORDER — ACETAMINOPHEN 325 MG PO TABS: 325.0000 mg | ORAL_TABLET | ORAL | Status: DC | PRN

## 2020-01-09 MED ORDER — SUGAMMADEX SODIUM 200 MG/2ML IV SOLN
INTRAVENOUS | Status: DC | PRN
  Administered 2020-01-09: 350 mg via INTRAVENOUS

## 2020-01-09 MED ORDER — SODIUM CHLORIDE 0.9 % IV SOLN
2.0000 g | INTRAVENOUS | Status: AC
  Administered 2020-01-09: 2 g via INTRAVENOUS
  Filled 2020-01-09: qty 20

## 2020-01-09 MED ORDER — PROPOFOL 10 MG/ML IV BOLUS
INTRAVENOUS | Status: AC
  Filled 2020-01-09: qty 40

## 2020-01-09 MED ORDER — CHLORHEXIDINE GLUCONATE 0.12 % MT SOLN
15.0000 mL | Freq: Once | OROMUCOSAL | Status: AC
  Administered 2020-01-09: 15 mL via OROMUCOSAL

## 2020-01-09 MED ORDER — OXYCODONE HCL 5 MG PO TABS: 5.0000 mg | ORAL_TABLET | Freq: Once | ORAL | Status: AC | PRN

## 2020-01-09 MED ORDER — SUCCINYLCHOLINE CHLORIDE 200 MG/10ML IV SOSY
PREFILLED_SYRINGE | INTRAVENOUS | Status: DC | PRN
  Administered 2020-01-09: 160 mg via INTRAVENOUS

## 2020-01-09 MED ORDER — METHYLENE BLUE 0.5 % INJ SOLN
INTRAVENOUS | Status: AC
  Filled 2020-01-09: qty 10

## 2020-01-09 MED ORDER — ONDANSETRON HCL 4 MG/2ML IJ SOLN
INTRAMUSCULAR | Status: DC | PRN
  Administered 2020-01-09: 4 mg via INTRAVENOUS

## 2020-01-09 MED ORDER — DEXAMETHASONE SODIUM PHOSPHATE 10 MG/ML IJ SOLN
INTRAMUSCULAR | Status: AC
  Filled 2020-01-09: qty 1

## 2020-01-09 MED ORDER — MEPERIDINE HCL 50 MG/ML IJ SOLN: 6.2500 mg | INTRAMUSCULAR | Status: DC | PRN

## 2020-01-09 SURGICAL SUPPLY — 35 items
BENZOIN TINCTURE PRP APPL 2/3 (GAUZE/BANDAGES/DRESSINGS) ×2 IMPLANT
BLADE SURG 15 STRL LF DISP TIS (BLADE) IMPLANT
BLADE SURG 15 STRL SS (BLADE)
BRIEF STRETCH FOR OB PAD LRG (UNDERPADS AND DIAPERS) ×2 IMPLANT
CNTNR URN SCR LID CUP LEK RST (MISCELLANEOUS) ×1 IMPLANT
CONT SPEC 4OZ STRL OR WHT (MISCELLANEOUS) ×2
COVER SURGICAL LIGHT HANDLE (MISCELLANEOUS) ×2 IMPLANT
COVER WAND RF STERILE (DRAPES) IMPLANT
DECANTER SPIKE VIAL GLASS SM (MISCELLANEOUS) ×2 IMPLANT
DRAPE LAPAROTOMY T 102X78X121 (DRAPES) ×2 IMPLANT
DRSG PAD ABDOMINAL 8X10 ST (GAUZE/BANDAGES/DRESSINGS) ×2 IMPLANT
ELECT REM PT RETURN 15FT ADLT (MISCELLANEOUS) ×2 IMPLANT
GAUZE 4X4 16PLY RFD (DISPOSABLE) ×2 IMPLANT
GAUZE SPONGE 4X4 12PLY STRL (GAUZE/BANDAGES/DRESSINGS) ×2 IMPLANT
GLOVE ECLIPSE 8.0 STRL XLNG CF (GLOVE) ×2 IMPLANT
GLOVE INDICATOR 8.0 STRL GRN (GLOVE) ×2 IMPLANT
GOWN STRL REUS W/TWL XL LVL3 (GOWN DISPOSABLE) ×4 IMPLANT
KIT BASIN (CUSTOM PROCEDURE TRAY) ×2 IMPLANT
KIT TURNOVER KIT A (KITS) IMPLANT
LOOP VESSEL MAXI BLUE (MISCELLANEOUS) IMPLANT
NEEDLE HYPO 22GX1.5 SAFETY (NEEDLE) ×2 IMPLANT
PACK BASIC VI WITH GOWN DISP (CUSTOM PROCEDURE TRAY) ×2 IMPLANT
PENCIL SMOKE EVACUATOR (MISCELLANEOUS) IMPLANT
SHEARS HARMONIC 9CM CVD (BLADE) IMPLANT
SURGILUBE 2OZ TUBE FLIPTOP (MISCELLANEOUS) ×2 IMPLANT
SUT CHROMIC 2 0 SH (SUTURE) ×2 IMPLANT
SUT CHROMIC 3 0 SH 27 (SUTURE) IMPLANT
SUT VIC AB 2-0 SH 27 (SUTURE)
SUT VIC AB 2-0 SH 27X BRD (SUTURE) IMPLANT
SUT VIC AB 2-0 UR6 27 (SUTURE) ×12 IMPLANT
SYR 20ML LL LF (SYRINGE) ×2 IMPLANT
SYR 3ML LL SCALE MARK (SYRINGE) IMPLANT
TOWEL OR 17X26 10 PK STRL BLUE (TOWEL DISPOSABLE) ×2 IMPLANT
TOWEL OR NON WOVEN STRL DISP B (DISPOSABLE) ×2 IMPLANT
YANKAUER SUCT BULB TIP 10FT TU (MISCELLANEOUS) ×2 IMPLANT

## 2020-01-09 NOTE — Anesthesia Procedure Notes (Signed)
Procedure Name: Intubation Date/Time: 01/09/2020 11:12 AM Performed by: Niel Hummer, CRNA Pre-anesthesia Checklist: Patient identified, Emergency Drugs available, Suction available and Patient being monitored Patient Re-evaluated:Patient Re-evaluated prior to induction Oxygen Delivery Method: Circle system utilized Preoxygenation: Pre-oxygenation with 100% oxygen Induction Type: IV induction and Rapid sequence Laryngoscope Size: Mac and 4 Grade View: Grade II Tube type: Oral Tube size: 7.0 mm Number of attempts: 1 Airway Equipment and Method: Stylet Placement Confirmation: ETT inserted through vocal cords under direct vision,  positive ETCO2 and breath sounds checked- equal and bilateral Secured at: 23 cm Tube secured with: Tape Dental Injury: Teeth and Oropharynx as per pre-operative assessment

## 2020-01-09 NOTE — Interval H&P Note (Signed)
History and Physical Interval Note:  01/09/2020 10:51 AM  Kayla [name]  has presented today for surgery, with the diagnosis of EXTERNAL HEMORRHOIDS, HEMORRHOIDS PROLAPSED GRADE 3 WITH BLEEDING AND WITH PAIN.  The various methods of treatment have been discussed with the patient and family. After consideration of risks, benefits and other options for treatment, the patient has consented to  Procedure(s): ANORECTAL EXAM UNDER ANESTHESIA WITH HEMORRHOIDECTOMY, HEMORRHOIDAL LIGATION/PEXY (N/A) as a surgical intervention.  The patient's history has been reviewed, patient examined, no change in status, stable for surgery.  I have reviewed the patient's chart and labs.  Questions were answered to the patient's satisfaction.    I have re-reviewed the the patient's records, history, medications, and allergies.  I have re-examined the patient.  I again discussed intraoperative plans and goals of post-operative recovery.  The patient agrees to proceed.  Jan L. Glowacki  08/12/1980 [id number]  Patient Care Team: Patient, No Pcp Per as PCP - General (General Practice) Karie Soda, MD as Consulting Physician (General Surgery) Rachael Fee, MD as Attending Physician (Gastroenterology) Maxie Better, MD as Consulting Physician (Obstetrics and Gynecology)  Patient Active Problem List   Diagnosis Date Noted   External hemorrhoids with complication 06/17/2019   Internal prolapsed hemorrhoids 06/17/2019   Postpartum care following cesarean delivery (5/19) 11/20/2018   Indication for care in labor or delivery 11/19/2018   Previous cesarean section complicating pregnancy, antepartum condition or complication 11/19/2018   Normal labor 11/19/2018   Advanced maternal age (AMA) in pregnancy 02/13/2014   Morbid obesity with BMI of 50.0-59.9, adult (HCC) 02/13/2014    Past Medical History:  Diagnosis Date   Abnormal Pap smear    History of chlamydia    History of hemorrhoids     Hypertension    on labetalol for 2 weeks at time of delivery due to blood pressure issues in 11/2018    Morbidly obese (HCC)    Varicose vulva in pregnancy     Past Surgical History:  Procedure Laterality Date   CESAREAN SECTION N/A 04/27/2014   Procedure: CESAREAN SECTION;  Surgeon: Meriel Pica, MD;  Location: [facility];  Service: Obstetrics;  Laterality: N/A;   CESAREAN SECTION WITH BILATERAL TUBAL LIGATION Bilateral 11/19/2018   Procedure: CESAREAN SECTION WITH BILATERAL TUBAL LIGATION;  Surgeon: Maxie Better, MD;  Location: [facility];  Service: Obstetrics;  Laterality: Bilateral;   CHOLECYSTECTOMY  2007   COLONOSCOPY WITH PROPOFOL N/A 02/10/2016   Procedure: COLONOSCOPY WITH PROPOFOL;  Surgeon: Rachael Fee, MD;  Location: [facility];  Service: Endoscopy;  Laterality: N/A;   OVARIAN CYST DRAINAGE  2003    Social History   Socioeconomic History   Marital status: Married    Spouse name: Marcy Salvo   Number of children: 3   Years of education: 12   Highest education level: Not on file  Occupational History   Occupation: human resources  Tobacco Use   Smoking status: Never Smoker   Smokeless tobacco: Never Used  Building services engineer Use: Never used  Substance and Sexual Activity   Alcohol use: No   Drug use: No   Sexual activity: Yes    Birth control/protection: None  Other Topics Concern   Not on file  Social History Narrative   Not on file   Social Determinants of Health   Financial Resource Strain:    Difficulty of Paying Living Expenses:   Food Insecurity:    Worried About Programme researcher, broadcasting/film/video in the  Last Year:    Barista in the Last Year:   Transportation Needs:    Freight forwarder (Medical):    Lack of Transportation (Non-Medical):   Physical Activity:    Days of Exercise per Week:    Minutes of Exercise per Session:   Stress:    Feeling of Stress :   Social Connections:    Frequency of Communication with Friends and Family:     Frequency of Social Gatherings with Friends and Family:    Attends Religious Services:    Active Member of Clubs or Organizations:    Attends Engineer, structural:    Marital Status:   Intimate Partner Violence:    Fear of Current or Ex-Partner:    Emotionally Abused:    Physically Abused:    Sexually Abused:     Family History  Problem Relation Age of Onset   Cardiomyopathy Father    Heart failure Father    Diabetes Father    Hypertension Father    Heart attack Father    Hypertension Paternal Uncle    Cancer Paternal Uncle         type unknown   Breast cancer Maternal Grandmother    Anesthesia problems Neg Hx    Hypotension Neg Hx    Malignant hyperthermia Neg Hx    Pseudochol deficiency Neg Hx     Medications Prior to Admission  Medication Sig Dispense Refill Last Dose   acetaminophen (TYLENOL) 500 MG tablet Take 2 tablets (1,000 mg total) by mouth every 6 (six) hours. (Patient taking differently: Take 1,000 mg by mouth every 6 (six) hours as needed for moderate pain or headache. ) 30 tablet 0 Past Week at Unknown time   loratadine (CLARITIN) 10 MG tablet Take 10 mg by mouth every other day.   Past Week at Unknown time   Saccharomyces boulardii (DAILY PROBIOTIC SUPPLEMENT PO) Take by mouth. Pt takes daily   Past Week at Unknown time    Current Facility-Administered Medications  Medication Dose Route Frequency Provider Last Rate Last Admin   bupivacaine liposome (EXPAREL) 1.3 % injection 266 mg  20 mL Infiltration Once Karie Soda, MD       cefTRIAXone (ROCEPHIN) 2 g in sodium chloride 0.9 % 100 mL IVPB  2 g Intravenous On Call to OR Karie Soda, MD       And   metroNIDAZOLE (FLAGYL) IVPB 500 mg  500 mg Intravenous On Call to OR Karie Soda, MD       Chlorhexidine Gluconate Cloth 2 % PADS 6 each  6 each Topical Once Karie Soda, MD       And   Chlorhexidine Gluconate Cloth 2 % PADS 6 each  6 each Topical Once Karie Soda, MD       feeding supplement  (ENSURE PRE-SURGERY) liquid 296 mL  296 mL Oral Once Karie Soda, MD       lactated ringers infusion   Intravenous Continuous Trevor Iha, MD       lactated ringers infusion   Intravenous Continuous Bethena Midget, MD 50 mL/hr at 01/09/20 0901 New Bag at 01/09/20 0901     Allergies  Allergen Reactions   Bactrim Nausea And Vomiting and Other (See Comments)    Overall makes her feel "horrible"  Body aches n/v, HA   Iron Metal [Iron] Rash    pain   Silver Rash    pain    BP (!) 173/90   Pulse 80  Temp 98.9 F (37.2 C) (Oral)   Resp 17   Ht 5\' 4"  (1.626 m)   Wt (!) 165.6 kg   LMP 01/04/2020   SpO2 100%   BMI 62.65 kg/m   Labs: Results for orders placed or performed during the hospital encounter of 01/09/20 (from the past 48 hour(s))  Pregnancy, urine     Status: None   Collection Time: 01/09/20  8:33 AM  Result Value Ref Range   Preg Test, Ur NEGATIVE NEGATIVE    Comment:        THE SENSITIVITY OF THIS METHODOLOGY IS >20 mIU/mL. Performed at [facility], [address], [place], [place]   CBC per protocol     Status: None   Collection Time: 01/09/20  9:00 AM  Result Value Ref Range   WBC 4.9 4.0 - 10.5 K/uL   RBC 4.41 3.87 - 5.11 MIL/uL   Hemoglobin 13.5 12.0 - 15.0 g/dL   HCT 03/11/20 36 - 46 %   MCV 93.4 80.0 - 100.0 fL   MCH 30.6 26.0 - 34.0 pg   MCHC 32.8 30.0 - 36.0 g/dL   RDW 93.9 03.0 - 09.2 %   Platelets 331 150 - 400 K/uL   nRBC 0.0 0.0 - 0.2 %    Comment: Performed at [facility], [address], [place], [place]    Imaging / Studies: No results found.   [id number], M.D., F.A.C.S. Gastrointestinal and Minimally Invasive Surgery [facility] [address] [place], [place] [phone] Main / Paging  01/09/2020 10:51 AM    03/11/2020

## 2020-01-09 NOTE — Discharge Instructions (Signed)
ANORECTAL SURGERY:  POST OPERATIVE INSTRUCTIONS  ######################################################################  EAT Start with a pureed / full liquid diet After 24 hours, gradually transition to a high fiber diet.    CONTROL PAIN Control pain so you can tolerate bowel movements,  walk, sleep, tolerate sneezing/coughing, and go up/down stairs.   HAVE A BOWEL MOVEMENT DAILY Keep your bowels regular to avoid problems.   Taking a fiber supplement every day to keep bowels soft.   Try a laxative to override constipation. Use an antidairrheal to slow down diarrhea.   Call if not better after 2 tries  WALK Walk an hour a day.  Control your pain to do that.   CALL IF YOU HAVE PROBLEMS/CONCERNS Call if you are still struggling despite following these instructions. Call if you have concerns not answered by these instructions  ######################################################################    1. Take your usually prescribed home medications unless otherwise directed.  2. DIET: Follow a light bland diet & liquids the first 24 hours after arrival home, such as soup, liquids, starches, etc.  Be sure to drink plenty of fluids.  Quickly advance to a usual solid diet within a few days.  Avoid fast food or heavy meals as your are more likely to get nauseated or have irregular bowels.  A low-fat, high-fiber diet for the rest of your life is ideal.  3. PAIN CONTROL: a. Pain is best controlled by a usual combination of three different methods TOGETHER: i. Ice/Heat ii. Over the counter pain medication iii. Prescription pain medication b. Expect swelling and discomfort in the anus/rectal area.  Warm water baths (30-60 minutes up to 6 times a day, especially after bowel meovements) will help. Use ice for the first few days to help decrease swelling and bruising, then switch to heat such as warm towels, sitz baths, warm baths, etc to help relax tight/sore spots and speed recovery.   Some people prefer to use ice alone, heat alone, alternating between ice & heat.  Experiment to what works for you.   c. It is helpful to take an over-the-counter pain medication continuously for the first few weeks.  Choose one of the following that works best for you: i. Naproxen (Aleve, etc)  Two 220mg tabs twice a day ii. Ibuprofen (Advil, etc) Three 200mg tabs four times a day (every meal & bedtime) iii. Acetaminophen (Tylenol, etc) 500-650mg four times a day (every meal & bedtime) d. A  prescription for pain medication (such as oxycodone, hydrocodone, etc) should be given to you upon discharge.  Take your pain medication as prescribed.  i. If you are having problems/concerns with the prescription medicine (does not control pain, nausea, vomiting, rash, itching, etc), please call us [phone] to see if we need to switch you to a different pain medicine that will work better for you and/or control your side effect better. ii. If you need a refill on your pain medication, please contact your pharmacy.  They will contact our office to request authorization. Prescriptions will not be filled after 5 pm or on week-ends.  If can take up to 48 hours for it to be filled & ready so avoid waiting until you are down to thel ast pill. e. A topical cream (Dibucaine) or a prescription for a cream (such as diltiazem 2% gel) may be given to you.  Many people find relief with topical creams.  Some people find it burns too much.  Experiment.  If it helps, use it.  If it burns, don't using   it.  Use a Sitz Bath 4-8 times a day for relief   Sitz Bath A sitz bath is a warm water bath taken in the sitting position that covers only the hips and buttocks. It may be used for either healing or hygiene purposes. Sitz baths are also used to relieve pain, itching, or muscle spasms. The water may contain medicine. Moist heat will help you heal and relax.  HOME CARE INSTRUCTIONS  Take 3 to 4 sitz baths a day. 1. Fill the  bathtub half full with warm water. 2. Sit in the water and open the drain a little. 3. Turn on the warm water to keep the tub half full. Keep the water running constantly. 4. Soak in the water for 15 to 20 minutes. 5. After the sitz bath, pat the affected area dry first.   4. KEEP YOUR BOWELS REGULAR a. The goal is one soft bowel movement a day b. Avoid getting constipated.  Between the surgery and the pain medications, it is common to experience some constipation.  Increasing fluid intake and taking a fiber supplement (such as Metamucil, Citrucel, FiberCon, MiraLax, etc) 2-3 times a day regularly will usually help prevent this problem from occurring.  A mild laxative (prune juice, Milk of Magnesia, MiraLax, etc) should be taken according to package directions if there are no bowel movements after 48 hours. c. Watch out for diarrhea.  If you have many loose bowel movements, simplify your diet to bland foods & liquids for a few days.  Stop any stool softeners and decrease your fiber supplement.  Switching to mild anti-diarrheal medications (Kayopectate, Pepto Bismol) can help.  Can try an imodium/loperamide dose.  If this worsens or does not improve, please call us.  5. Wound Care  a. Remove your bandages with your first bowel movement, usually the day after surgery.  You may have packing if you had an abscess.  Let any packing or gauze fall come out.   b. Wear an absorbent pad or soft cotton balls in your underwear as needed to catch any drainage and help keep the area  c. Keep the area clean and dry.  Bathe / shower every day.  Keep the area clean by showering / bathing over the incision / wound.   It is okay to soak an open wound to help wash it.  Consider using a squeeze bottle filled with warm water to gently wash the anal area.  Wet wipes or showers / gentle washing after bowel movements is often less traumatic than regular toilet paper. d. You will often notice bleeding with bowel movements.   This should slow down by the end of the first week of surgery.  Sitting on an ice pack can help. e. Expect some drainage.  This should slow down by the end of the first week of surgery, but you will have occasional bleeding or drainage up to a few months after surgery.  Wear an absorbent pad or soft cotton gauze in your underwear until the drainage stops.  6. ACTIVITIES as tolerated:   a. You may resume regular (light) daily activities beginning the next day--such as daily self-care, walking, climbing stairs--gradually increasing activities as tolerated.  If you can walk 30 minutes without difficulty, it is safe to try more intense activity such as jogging, treadmill, bicycling, low-impact aerobics, swimming, etc. b. Save the most intensive and strenuous activity for last such as sit-ups, heavy lifting, contact sports, etc  Refrain from any heavy lifting or straining   until you are off narcotics for pain control.   c. DO NOT PUSH THROUGH PAIN.  Let pain be your guide: If it hurts to do something, don't do it.  Pain is your body warning you to avoid that activity for another week until the pain goes down. d. You may drive when you are no longer taking prescription pain medication, you can comfortably sit for long periods of time, and you can safely maneuver your car and apply brakes. e. You may have sexual intercourse when it is comfortable.  7. FOLLOW UP in our office a. Please call [facility] at [phone] to set up an appointment to see your surgeon in the office for a follow-up appointment approximately 2-3 weeks after your surgery. b. Make sure that you call for this appointment the day you arrive home to ensure a convenient appointment time.  8. IF YOU HAVE DISABILITY OR FAMILY LEAVE FORMS, BRING THEM TO THE OFFICE FOR PROCESSING.  DO NOT GIVE THEM TO YOUR DOCTOR.        WHEN TO CALL US [phone]: 1. Poor pain control 2. Reactions / problems with new medications (rash/itching, nausea,  etc)  3. Fever over 101.5 F (38.5 C) 4. Inability to urinate 5. Nausea and/or vomiting 6. Worsening swelling or bruising 7. Continued bleeding from incision. 8. Increased pain, redness, or drainage from the incision  The clinic staff is available to answer your questions during regular business hours (8:30am-5pm).  Please don't hesitate to call and ask to speak to one of our nurses for clinical concerns.   A surgeon from [facility] is always on call at the hospitals   If you have a medical emergency, go to the nearest emergency room or call 911.    [facility] [address], [address], [place], [place]  [address] ? MAIN: [phone] ? TOLL FREE: [phone] ? FAX [phone] [email or web address]    HEMORRHOIDS   Hemorrhoidal piles are natural clusters of blood vessels that help the rectum and anal canal stretch to hold stool and allow bowel movements.  Most people will develop a flare of hemorrhoids in their lifetime.  When hemorrhoidals are irritated, they can swell, burn, itch, cause pain, and bleed.  Most flares will calm down gradually within a few weeks.  However, once hemorrhoids are created, they tend to flare more easily.  Fortunately, good habits and simple medical treatment usually control hemorrhoids well, and surgery is needed only in severe cases.  TREATMENT OF HEMORRHOID FLARE Warm soaks. 4-8 times a day This helps more than any topical medication.   1. A sitz bath is a warm water bath taken in the sitting position that covers only the hips and buttocks.Fill the bathtub half full with warm water. 2. Soak in the water for 15 to 30 minutes. 3. After the sitz bath, pat the affected area dry first.  Normalize your bowels.  Extremes of diarrhea or constipation will make hemorrhoids worse.  One soft bowel movement a day is the goal.   Wet wipes instead of toilet paper Pain control with a NSAID such as ibuprofen (Advil) or  naproxen (Aleve) or acetaminophen (Tylenol) around the clock.  Narcotics are constipating and should be minimized if possible Topical creams contain steroids (bydrocortisone) or local anesthetic (xylocaine) can help make pain and itching more tolerable.    TROUBLESHOOTING IRREGULAR BOWELS 1) Avoid extremes of bowel movements (no bad constipation/diarrhea) 2) Miralax 17gm in 8oz. water or juice every   day. May use twice a day.  3) Gas-x or Phazyme as needed for gas & bloating.  4) Soft & bland diet. No spicy, greasy, or fried foods.  5) Omeprazole over-the-counter as needed  6) May hold gluten/wheat products from diet to see if symptoms improve.  7)  May try probiotics (Align, Activa, etc) to help calm the bowels down 7) If symptoms become worse: Call back immediately.   

## 2020-01-09 NOTE — Transfer of Care (Signed)
Immediate Anesthesia Transfer of Care Note  Patient: Kayla [name]  Procedure(s) Performed: HEMORRHOIDECTOMY x4 HEMORRHOIDAL LIGATION/PEXY ANORECTAL EXAM UNDER ANESTHESIA (N/A )  Patient Location: PACU  Anesthesia Type:General  Level of Consciousness: awake, alert  and oriented  Airway & Oxygen Therapy: Patient Spontanous Breathing and Patient connected to face mask oxygen  Post-op Assessment: Report given to RN, Post -op Vital signs reviewed and stable and Patient moving all extremities X 4  Post vital signs: Reviewed and stable  Last Vitals:  Vitals Value Taken Time  BP    Temp    Pulse 77 01/09/20 1254  Resp 17 01/09/20 1254  SpO2 100 % 01/09/20 1254  Vitals shown include unvalidated device data.  Last Pain:  Vitals:   01/09/20 0833  TempSrc: Oral         Complications: No complications documented.

## 2020-01-09 NOTE — Anesthesia Postprocedure Evaluation (Signed)
Anesthesia Post Note  Patient: Kayla [name]  Procedure(s) Performed: HEMORRHOIDECTOMY x4 HEMORRHOIDAL LIGATION/PEXY ANORECTAL EXAM UNDER ANESTHESIA (N/A )     Patient location during evaluation: PACU Anesthesia Type: General Level of consciousness: awake and alert Pain management: pain level controlled Vital Signs Assessment: post-procedure vital signs reviewed and stable Respiratory status: spontaneous breathing, nonlabored ventilation, respiratory function stable and patient connected to nasal cannula oxygen Cardiovascular status: blood pressure returned to baseline and stable Postop Assessment: no apparent nausea or vomiting Anesthetic complications: no   No complications documented.  Last Vitals:  Vitals:   01/09/20 0833 01/09/20 1254  BP: (!) 173/90   Pulse: 80 77  Resp: 17 18  Temp: 37.2 C 36.4 C  SpO2: 100% 100%    Last Pain:  Vitals:   01/09/20 1254  TempSrc:   PainSc: Asleep                 Symir Mah

## 2020-01-09 NOTE — Op Note (Signed)
01/09/2020  12:43 PM  PATIENT:  Kayla [name]  39 y.o. female  Patient Care Team: Patient, No Pcp Per as PCP - General (General Practice) Karie Soda, MD as Consulting Physician (General Surgery) Rachael Fee, MD as Attending Physician (Gastroenterology) Maxie Better, MD as Consulting Physician (Obstetrics and Gynecology)  PRE-OPERATIVE DIAGNOSIS:  EXTERNAL HEMORRHOIDS, HEMORRHOIDS PROLAPSED GRADE 3 WITH BLEEDING AND WITH PAIN  POST-OPERATIVE DIAGNOSIS:  EXTERNAL HEMORRHOIDS, HEMORRHOIDS PROLAPSED GRADE 3 WITH BLEEDING AND WITH PAIN  PROCEDURE:  Procedure(s): HEMORRHOIDECTOMY x4 HEMORRHOIDAL LIGATION/PEXY ANORECTAL EXAM UNDER ANESTHESIA   Internal and external hemorrhoidectomy   x4 Internal hemorrhoidal ligation and pexy Anorectal examination under anesthesia  SURGEON:  Ardeth Sportsman, MD  ANESTHESIA:   General Anorectal & Local field block (0.25% bupivacaine with epinephrine mixed with Liposomal bupivacaine (Experel)   EBL:  Total I/O In: 1200 [I.V.:1000; IV Piggyback:200] Out: - .  See operative record  Delay start of Pharmacological VTE agent (>24hrs) due to surgical blood loss or risk of bleeding:  NO  DRAINS: NONE  SPECIMEN:   Internal & external hemorrhoid x4  DISPOSITION OF SPECIMEN:  PATHOLOGY  COUNTS:  YES  PLAN OF CARE: Discharge home after PACU  PATIENT DISPOSITION:  PACU - hemodynamically stable.  INDICATION: Pleasant patient with struggles with hemorrhoids.  Not able to be managed in the office despite an improved bowel regimen.  I recommended examination under anesthesia and surgical treatment:  The anatomy & physiology of the anorectal region was discussed.  The pathophysiology of hemorrhoids and differential diagnosis was discussed.  Natural history risks without surgery was discussed.   I stressed the importance of a bowel regimen to have daily soft bowel movements to minimize progression of disease.  Interventions such as  sclerotherapy & banding were discussed.  The patient's symptoms are not adequately controlled by medicines and other non-operative treatments.  I feel the risks & problems of no surgery outweigh the operative risks; therefore, I recommended surgery to treat the hemorrhoids by ligation, pexy, and possible resection.  Risks such as bleeding, infection, need for further treatment, heart attack, death, and other risks were discussed.   I noted a good likelihood this will help address the problem.  Goals of post-operative recovery were discussed as well.  Possibility that this will not correct all symptoms was explained.  Post-operative pain, bleeding, constipation, urinary difficulties, and other problems after surgery were discussed.  We will work to minimize complications.   Educational handouts further explaining the pathology, treatment options, and bowel regimen were given as well.  Questions were answered.  The patient expresses understanding & wishes to proceed with surgery.  OR FINDINGS: Enlarged internal hemorrhoids with giant external components especially right posterior greater than right anterior greater than left lateral.  DESCRIPTION:   Informed consent was confirmed. Patient underwent general anesthesia without difficulty. Patient was placed into prone positioning.  The perianal region was prepped and draped in sterile fashion. Surgical time-out confirmed our plan.  I did digital rectal examination and then transitioned over to anoscopy to get a sense of the anatomy.  Findings noted above.   I proceeded to do hemorrhoidal ligation and pexy.  I used a 2-0 Vicryl suture on a UR-6 needle in a figure-of-eight fashion 6 cm proximal to the anal verge.  I started at the largest hemorrhoid pile, right posterior.  I excised the giant external component and then came internally, sparing anoderm.   I then ran that stitch longitudinally more distally to close the hemorrhoidectomy  wound to the anal  verge over a Parks self retaining retractor to avoid narrowing of the anal canal.  I then tied that stitch down to cause a hemorrhoidopexy.  Because of redundant hemorrhoidal tissue too bulky to merely ligate or pexy, I excised the excess internal hemorrhoid piles longitudinally in a fusiform biconcave fashion, at the  right anterior, right posterior and left lateral locations, sparing the anal canal to avoid narrowing. I then did hemorrhoidal ligation and pexy at the other 3 columns.  Did not Exter suture at the right posterior internal pile to help tied down.  At the completion of this, all 6 anorectal columns were ligated and pexied in the classic hexagonal fashion (right anterior/lateral/posterior, left anterior/lateral/posterior).  I closed the external part of the hemorrhoidectomy wounds with interrupted horizontal mattress 2-0 chromic suture, leaving the last 5 mm open to allow natural drainage.    I redid anoscopy & examination.  At completion of this, all hemorrhoids had been removed or reduced into the rectum.  There is no more prolapse.  Internal & external anatomy was more more normal.  Hemostasis was good.  Fluffed gauze was on-laid over the perianal region.  No packing done.  Patient is being extubated go to go to the recovery room.  I had discussed postop care in detail with the patient in the preop holding area.  Instructions for post-operative recovery and prescriptions are written. I discussed operative findings, updated the patient's status, discussed probable steps to recovery, and gave postoperative recommendations to the patient's spouse.  Recommendations were made.  Questions were answered.  He expressed understanding & appreciation.  Ardeth Sportsman, M.D., F.A.C.S. Gastrointestinal and Minimally Invasive Surgery [facility] [address], [address] [place], [place] [address] [phone] Main / Paging

## 2020-01-12 ENCOUNTER — Encounter (HOSPITAL_COMMUNITY): Payer: Self-pay | Admitting: Surgery

## 2020-01-12 LAB — SURGICAL PATHOLOGY

## 2021-10-17 ENCOUNTER — Ambulatory Visit (HOSPITAL_COMMUNITY)
Admission: RE | Admit: 2021-10-17 | Discharge: 2021-10-17 | Disposition: A | Discharge status: Home / Self Care | Payer: Federal, State, Local not specified - PPO | Source: Ambulatory Visit | Attending: Nurse Practitioner | Admitting: Nurse Practitioner

## 2021-10-17 ENCOUNTER — Encounter (HOSPITAL_COMMUNITY): Payer: Self-pay

## 2021-10-17 VITALS — BP 146/85 | HR 84 | Temp 98.4°F

## 2021-10-17 DIAGNOSIS — J029 Acute pharyngitis, unspecified: Secondary | ICD-10-CM

## 2021-10-17 DIAGNOSIS — R519 Headache, unspecified: Secondary | ICD-10-CM | POA: Diagnosis present

## 2021-10-17 LAB — POCT RAPID STREP A, ED / UC
Streptococcus, Group A Screen (Direct): NEGATIVE
Streptococcus, Group A Screen (Direct): NEGATIVE

## 2021-10-17 LAB — POCT INFECTIOUS MONO SCREEN, ED / UC: Mono Screen: NEGATIVE

## 2021-10-17 NOTE — Discharge Instructions (Addendum)
-   The strep throat test and mononucleosis screen were negative today ?-We are sending the strep throat test off for culture and will let you know if this comes out positive ?-Please continue supportive care-you can use warm salt water gargles, nasal rinses, Cepacol lozenges, and warm water with lemon and honey to help with the sore throat ?-Please seek care if your symptoms persist more than 7 to 10 days without improvement ?

## 2021-10-17 NOTE — ED Provider Notes (Signed)
?[facility] ? ? ? ?CSN: [id number] ?Arrival date & time: 10/17/21  1725 ? ? ?  ? ?History   ?Chief Complaint ?No chief complaint on file. ? ? ?HPI ?Kayla [name] is a 41 y.o. female.  ? ?Patient presents for sore throat, headache, nasal congestion ongoing for 3 days.  She reports initially, she was more tired and thinks she had fever with body ache.  She denies chest pain, shortness of breath, abdominal pain, nausea/vomiting.  She denies any new rash on her skin.  She reports her tonsils are swollen and she sees white patches on the back.  She reports it has been more difficult to eat because it hurts when she swallows.   ? ? ?Past Medical History:  ?Diagnosis Date  ? Abnormal Pap smear   ? History of chlamydia   ? History of hemorrhoids   ? Hypertension   ? on labetalol for 2 weeks at time of delivery due to blood pressure issues in 11/2018   ? Morbidly obese (HCC)   ? Varicose vulva in pregnancy   ? ? ?Patient Active Problem List  ? Diagnosis Date Noted  ? External hemorrhoids with complication 06/17/2019  ? Internal prolapsed hemorrhoids 06/17/2019  ? Postpartum care following cesarean delivery (5/19) 11/20/2018  ? Indication for care in labor or delivery 11/19/2018  ? Previous cesarean section complicating pregnancy, antepartum condition or complication 11/19/2018  ? Normal labor 11/19/2018  ? Advanced maternal age (AMA) in pregnancy 02/13/2014  ? Morbid obesity with BMI of 50.0-59.9, adult (HCC) 02/13/2014  ? ? ?Past Surgical History:  ?Procedure Laterality Date  ? CESAREAN SECTION N/A 04/27/2014  ? Procedure: CESAREAN SECTION;  Surgeon: Meriel Pica, MD;  Location: [facility];  Service: Obstetrics;  Laterality: N/A;  ? CESAREAN SECTION WITH BILATERAL TUBAL LIGATION Bilateral 11/19/2018  ? Procedure: CESAREAN SECTION WITH BILATERAL TUBAL LIGATION;  Surgeon: Maxie Better, MD;  Location: [facility];  Service: Obstetrics;  Laterality: Bilateral;  ? CHOLECYSTECTOMY  2007  ? COLONOSCOPY WITH  PROPOFOL N/A 02/10/2016  ? Procedure: COLONOSCOPY WITH PROPOFOL;  Surgeon: Rachael Fee, MD;  Location: [facility];  Service: Endoscopy;  Laterality: N/A;  ? EVALUATION UNDER ANESTHESIA WITH HEMORRHOIDECTOMY N/A 01/09/2020  ? Procedure: HEMORRHOIDECTOMY x4 HEMORRHOIDAL LIGATION/PEXY ANORECTAL EXAM UNDER ANESTHESIA;  Surgeon: Karie Soda, MD;  Location: [facility];  Service: General;  Laterality: N/A;  ? OVARIAN CYST DRAINAGE  2003  ? ? ?OB History   ? ? Gravida  ?4  ? Para  ?3  ? Term  ?3  ? Preterm  ?0  ? AB  ?1  ? Living  ?3  ?  ? ? SAB  ?1  ? IAB  ?0  ? Ectopic  ?0  ? Multiple  ?0  ? Live Births  ?3  ?   ?  ?  ? ? ? ?Home Medications   ? ?Prior to Admission medications   ?Medication Sig Start Date End Date Taking? Authorizing Provider  ?acetaminophen (TYLENOL) 500 MG tablet Take 2 tablets (1,000 mg total) by mouth every 6 (six) hours. ?Patient taking differently: Take 1,000 mg by mouth every 6 (six) hours as needed for moderate pain or headache.  11/22/18   Neta Mends, CNM  ?loratadine (CLARITIN) 10 MG tablet Take 10 mg by mouth every other day.    [provider]  ?oxyCODONE (OXY IR/ROXICODONE) 5 MG immediate release tablet Take 1-2 tablets (5-10 mg total) by mouth every 6 (six) hours as  needed for moderate pain, severe pain or breakthrough pain. 01/09/20   Karie Soda, MD  ?Saccharomyces boulardii (DAILY PROBIOTIC SUPPLEMENT PO) Take by mouth. Pt takes daily    [provider]  ? ? ?Family History ?Family History  ?Problem Relation Age of Onset  ? Cardiomyopathy Father   ? Heart failure Father   ? Diabetes Father   ? Hypertension Father   ? Heart attack Father   ? Hypertension Paternal Uncle   ? Cancer Paternal Uncle   ?      type unknown  ? Breast cancer Maternal Grandmother   ? Anesthesia problems Neg Hx   ? Hypotension Neg Hx   ? Malignant hyperthermia Neg Hx   ? Pseudochol deficiency Neg Hx   ? ? ?Social History ?Social History  ? ?Tobacco Use  ? Smoking status: Never  ? Smokeless  tobacco: Never  ?Vaping Use  ? Vaping Use: Never used  ?Substance Use Topics  ? Alcohol use: No  ? Drug use: No  ? ? ? ?Allergies   ?Bactrim, Iron metal [iron], and Silver ? ? ?Review of Systems ?Review of Systems ?Per HPI ? ?Physical Exam ?Triage Vital Signs ?ED Triage Vitals  ?Enc Vitals Group  ?   BP 10/17/21 1836 (!) 146/85  ?   Pulse Rate 10/17/21 1836 84  ?   Resp --   ?   Temp 10/17/21 1836 98.4 ?F (36.9 ?C)  ?   Temp Source 10/17/21 1836 Oral  ?   SpO2 10/17/21 1836 98 %  ?   Weight --   ?   Height --   ?   Head Circumference --   ?   Peak Flow --   ?   Pain Score 10/17/21 1838 0  ?   Pain Loc --   ?   Pain Edu? --   ?   Excl. in GC? --   ? ?No data found. ? ?Updated Vital Signs ?BP (!) 146/85 (BP Location: Left Arm)   Pulse 84   Temp 98.4 ?F (36.9 ?C) (Oral)   SpO2 98%  ? ?Visual Acuity ?Right Eye Distance:   ?Left Eye Distance:   ?Bilateral Distance:   ? ?Right Eye Near:   ?Left Eye Near:    ?Bilateral Near:    ? ?Physical Exam ?Vitals and nursing note reviewed.  ?Constitutional:   ?   General: She is not in acute distress. ?   Appearance: She is well-developed. She is not toxic-appearing.  ?HENT:  ?   Head: Normocephalic and atraumatic.  ?   Right Ear: Tympanic membrane and ear canal normal. No drainage or swelling. No middle ear effusion. Tympanic membrane is not erythematous.  ?   Left Ear: Tympanic membrane and ear canal normal. No drainage, swelling or tenderness.  No middle ear effusion. Tympanic membrane is not erythematous.  ?   Nose: No congestion or rhinorrhea.  ?   Mouth/Throat:  ?   Mouth: Mucous membranes are moist.  ?   Pharynx: Oropharynx is clear. Uvula midline. Posterior oropharyngeal erythema present. No oropharyngeal exudate.  ?   Tonsils: Tonsillar exudate present. 2+ on the right. 2+ on the left.  ?Eyes:  ?   Extraocular Movements:  ?   Right eye: Normal extraocular motion.  ?   Left eye: Normal extraocular motion.  ?Cardiovascular:  ?   Rate and Rhythm: Normal rate and regular  rhythm.  ?Pulmonary:  ?   Effort: Pulmonary effort is normal. No respiratory  distress.  ?   Breath sounds: Normal breath sounds. No wheezing, rhonchi or rales.  ?Abdominal:  ?   General: Abdomen is flat. Bowel sounds are normal.  ?   Palpations: Abdomen is soft.  ?Musculoskeletal:  ?   Cervical back: Normal range of motion and neck supple.  ?Lymphadenopathy:  ?   Cervical: No cervical adenopathy.  ?Skin: ?   General: Skin is warm and dry.  ?   Capillary Refill: Capillary refill takes less than 2 seconds.  ?   Coloration: Skin is not pale.  ?   Findings: No erythema or rash.  ?Neurological:  ?   Mental Status: She is alert and oriented to person, place, and time.  ?Psychiatric:     ?   Behavior: Behavior is cooperative.  ? ? ? ?UC Treatments / Results  ?Labs ?(all labs ordered are listed, but only abnormal results are displayed) ?Labs Reviewed  ?CULTURE, GROUP A STREP [facility]  ?POCT RAPID STREP A, ED / UC  ?POCT INFECTIOUS MONO SCREEN, ED / UC  ?POCT RAPID STREP A, ED / UC  ? ? ?EKG ? ? ?Radiology ?No results found. ? ?Procedures ?Procedures (including critical care time) ? ?Medications Ordered in UC ?Medications - No data to display ? ?Initial Impression / Assessment and Plan / UC Course  ?I have reviewed the triage vital signs and the nursing notes. ? ?Pertinent labs & imaging results that were available during my care of the patient were reviewed by me and considered in my medical decision making (see chart for details). ? ?  ?Rapid strep test today is negative.  Will send for throat culture for confirmation.  Mononucleosis screen is also negative. ?Suspect symptoms are likely viral in etiology- continue supportive care with warm salt water gargles, nasal rinses.  Will give note for work.  If symptoms persist more than 7 to 10 days without improvement, please seek care. ?Final Clinical Impressions(s) / UC Diagnoses  ? ?Final diagnoses:  ?Acute pharyngitis, unspecified etiology  ?Acute nonintractable headache,  unspecified headache type  ? ? ? ?Discharge Instructions   ? ?  ?- The strep throat test and mononucleosis screen were negative today ?-We are sending the strep throat test off for culture and will let you know if this

## 2021-10-17 NOTE — ED Triage Notes (Signed)
Pt presents with c/o headache and sore throat X 3 days.  ? ?

## 2021-10-20 LAB — CULTURE, GROUP A STREP (THRC)

## 2022-09-01 ENCOUNTER — Other Ambulatory Visit (HOSPITAL_BASED_OUTPATIENT_CLINIC_OR_DEPARTMENT_OTHER): Payer: Self-pay

## 2022-09-01 MED ORDER — WEGOVY 0.5 MG/0.5ML ~~LOC~~ SOAJ
0.5000 mg | SUBCUTANEOUS | 1 refills | Status: AC
  Filled 2022-09-01: qty 2, 28d supply, fill #0

## 2022-09-02 ENCOUNTER — Other Ambulatory Visit (HOSPITAL_BASED_OUTPATIENT_CLINIC_OR_DEPARTMENT_OTHER): Payer: Self-pay

## 2022-09-05 ENCOUNTER — Other Ambulatory Visit (HOSPITAL_BASED_OUTPATIENT_CLINIC_OR_DEPARTMENT_OTHER): Payer: Self-pay

## 2022-09-06 ENCOUNTER — Encounter (HOSPITAL_BASED_OUTPATIENT_CLINIC_OR_DEPARTMENT_OTHER): Payer: Self-pay

## 2022-09-06 ENCOUNTER — Other Ambulatory Visit (HOSPITAL_BASED_OUTPATIENT_CLINIC_OR_DEPARTMENT_OTHER): Payer: Self-pay

## 2022-09-11 ENCOUNTER — Other Ambulatory Visit (HOSPITAL_BASED_OUTPATIENT_CLINIC_OR_DEPARTMENT_OTHER): Payer: Self-pay

## 2022-09-26 ENCOUNTER — Other Ambulatory Visit (HOSPITAL_BASED_OUTPATIENT_CLINIC_OR_DEPARTMENT_OTHER): Payer: Self-pay

## 2022-09-26 MED ORDER — MUPIROCIN 2 % EX OINT
TOPICAL_OINTMENT | CUTANEOUS | 0 refills | Status: AC
  Filled 2022-09-26: qty 22, 7d supply, fill #0

## 2022-09-26 MED ORDER — DOXYCYCLINE HYCLATE 100 MG PO TABS
100.0000 mg | ORAL_TABLET | Freq: Two times a day (BID) | ORAL | 0 refills | Status: AC
  Filled 2022-09-26: qty 20, 10d supply, fill #0

## 2022-10-02 ENCOUNTER — Other Ambulatory Visit (HOSPITAL_BASED_OUTPATIENT_CLINIC_OR_DEPARTMENT_OTHER): Payer: Self-pay

## 2022-10-02 MED ORDER — WEGOVY 1 MG/0.5ML ~~LOC~~ SOAJ
1.0000 mg | SUBCUTANEOUS | 0 refills | Status: DC
  Filled 2022-10-02: qty 2, 28d supply, fill #0

## 2022-10-24 ENCOUNTER — Other Ambulatory Visit (HOSPITAL_BASED_OUTPATIENT_CLINIC_OR_DEPARTMENT_OTHER): Payer: Self-pay

## 2022-10-24 MED ORDER — WEGOVY 1 MG/0.5ML ~~LOC~~ SOAJ
1.0000 mg | SUBCUTANEOUS | 0 refills | Status: AC
  Filled 2022-10-24: qty 2, 28d supply, fill #0

## 2022-10-26 ENCOUNTER — Other Ambulatory Visit (HOSPITAL_BASED_OUTPATIENT_CLINIC_OR_DEPARTMENT_OTHER): Payer: Self-pay

## 2024-04-24 ENCOUNTER — Other Ambulatory Visit (HOSPITAL_BASED_OUTPATIENT_CLINIC_OR_DEPARTMENT_OTHER): Payer: Self-pay

## 2024-04-24 MED ORDER — WEGOVY 0.25 MG/0.5ML ~~LOC~~ SOAJ
0.2500 mg | SUBCUTANEOUS | 0 refills | Status: AC
  Filled 2024-04-24: qty 2, 28d supply, fill #0

## 2024-05-06 ENCOUNTER — Other Ambulatory Visit (HOSPITAL_BASED_OUTPATIENT_CLINIC_OR_DEPARTMENT_OTHER): Payer: Self-pay
